# Patient Record
Sex: Female | Born: 1935 | Race: White | Hispanic: No | Marital: Married | Smoking: Former smoker
Health system: Southern US, Community
[De-identification: ages and names within clinical notes are randomized; demographics above are authoritative.]

## PROBLEM LIST (undated history)

## (undated) DIAGNOSIS — I10 Essential (primary) hypertension: Secondary | ICD-10-CM

## (undated) DIAGNOSIS — R112 Nausea with vomiting, unspecified: Secondary | ICD-10-CM

## (undated) DIAGNOSIS — C50919 Malignant neoplasm of unspecified site of unspecified female breast: Secondary | ICD-10-CM

## (undated) DIAGNOSIS — C50411 Malignant neoplasm of upper-outer quadrant of right female breast: Principal | ICD-10-CM

## (undated) DIAGNOSIS — H409 Unspecified glaucoma: Secondary | ICD-10-CM

## (undated) DIAGNOSIS — Z9889 Other specified postprocedural states: Secondary | ICD-10-CM

## (undated) HISTORY — DX: Unspecified glaucoma: H40.9

## (undated) HISTORY — PX: HERNIA REPAIR: SHX51

## (undated) HISTORY — PX: EYE SURGERY: SHX253

## (undated) HISTORY — DX: Malignant neoplasm of unspecified site of unspecified female breast: C50.919

## (undated) HISTORY — DX: Essential (primary) hypertension: I10

## (undated) HISTORY — PX: ABDOMINAL HYSTERECTOMY: SHX81

## (undated) HISTORY — PX: CHOLECYSTECTOMY: SHX55

## (undated) HISTORY — PX: CATARACT EXTRACTION: SUR2

## (undated) HISTORY — DX: Malignant neoplasm of upper-outer quadrant of right female breast: C50.411

---

## 1998-09-20 ENCOUNTER — Other Ambulatory Visit: Admission: RE | Admit: 1998-09-20 | Discharge: 1998-09-20 | Payer: Self-pay | Admitting: *Deleted

## 1999-10-11 ENCOUNTER — Other Ambulatory Visit: Admission: RE | Admit: 1999-10-11 | Discharge: 1999-10-11 | Payer: Self-pay | Admitting: *Deleted

## 2000-10-16 ENCOUNTER — Other Ambulatory Visit: Admission: RE | Admit: 2000-10-16 | Discharge: 2000-10-16 | Payer: Self-pay | Admitting: *Deleted

## 2000-10-17 ENCOUNTER — Other Ambulatory Visit: Admission: RE | Admit: 2000-10-17 | Discharge: 2000-10-17 | Payer: Self-pay | Admitting: *Deleted

## 2005-11-05 HISTORY — PX: BREAST SURGERY: SHX581

## 2007-04-17 ENCOUNTER — Ambulatory Visit (HOSPITAL_COMMUNITY): Admission: RE | Admit: 2007-04-17 | Discharge: 2007-04-18 | Payer: Self-pay | Admitting: Orthopedic Surgery

## 2007-07-04 ENCOUNTER — Encounter: Admission: RE | Admit: 2007-07-04 | Discharge: 2007-07-04 | Payer: Self-pay | Admitting: Emergency Medicine

## 2009-01-15 IMAGING — US US CAROTID DUPLEX BILAT
1 series · 14 of 24 positions shown · non-contrast
Comparison: none

CLINICAL DATA: Bilateral carotid bruit.  Hypertension.  Previous tobacco use. 
 BILATERAL CAROTID DUPLEX ULTRASOUND:
 There is no previous for comparison.

[Series 1: us carotid duplex bilat · 0.07mm/px · 14 of 66 slices shown]
[im 1/66]
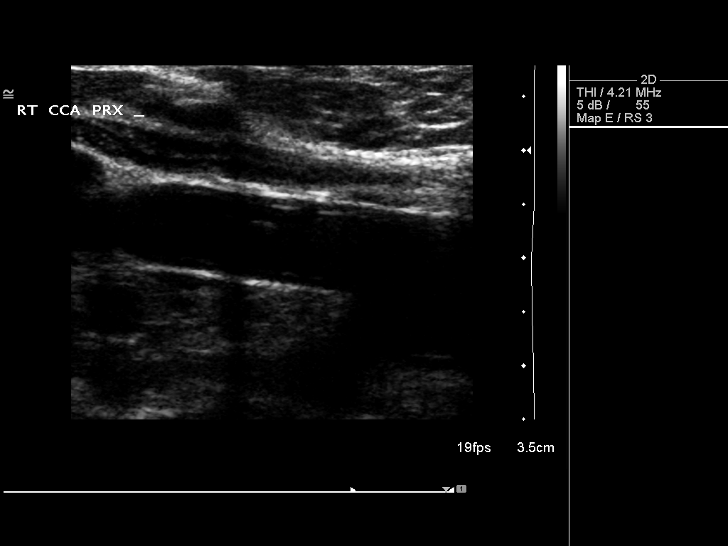
[im 6/66]
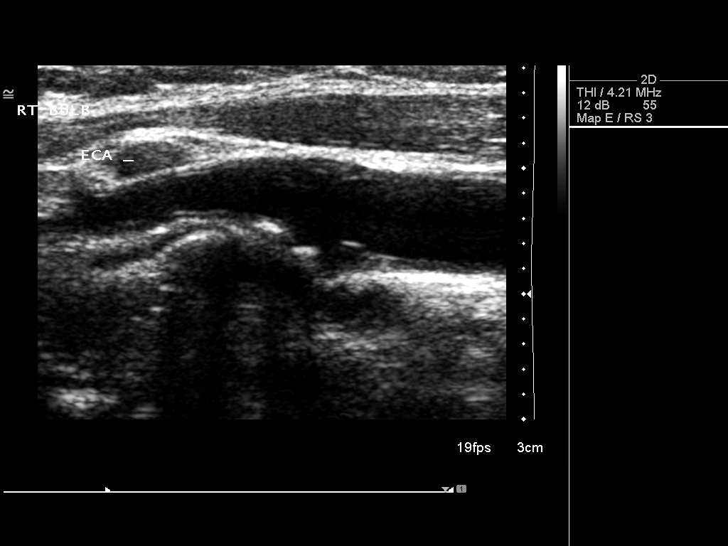
[im 12/66]
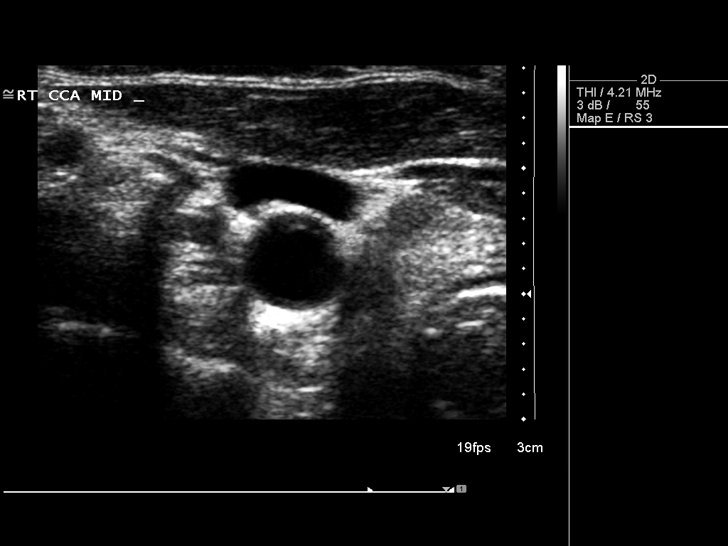
[im 17/66]
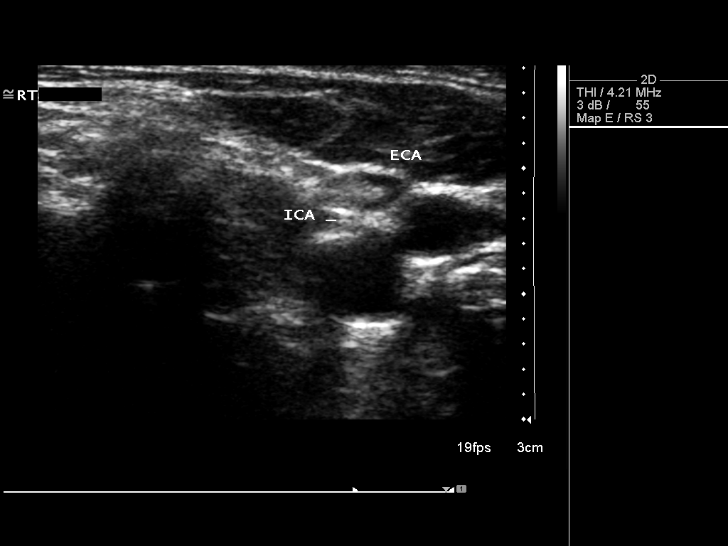
[im 20/66]
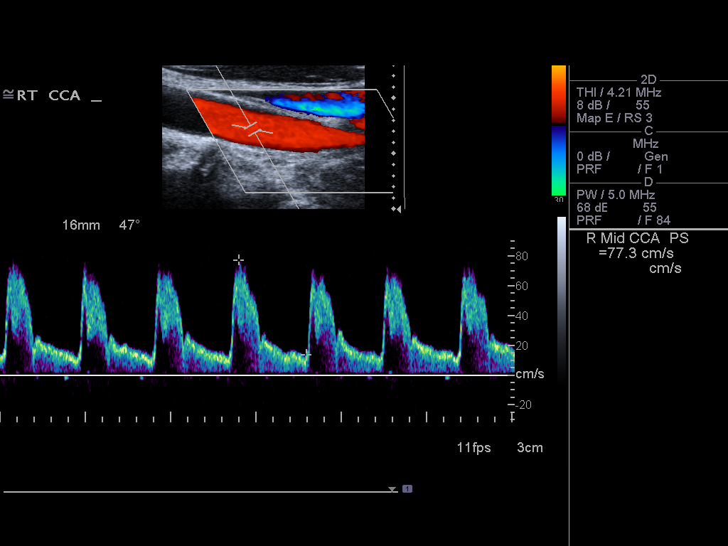
[im 26/66]
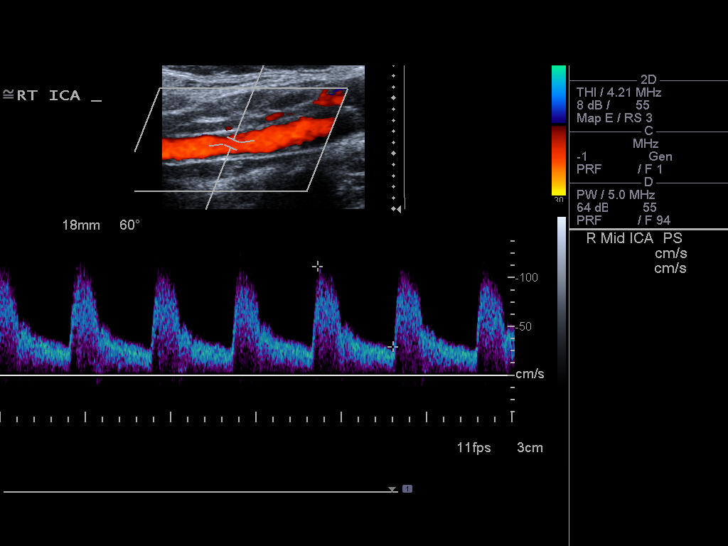
[im 32/66]
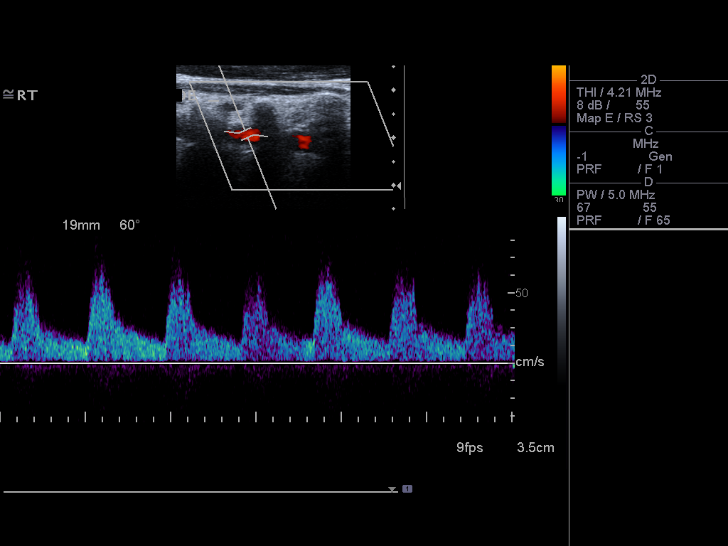
[im 34/66]
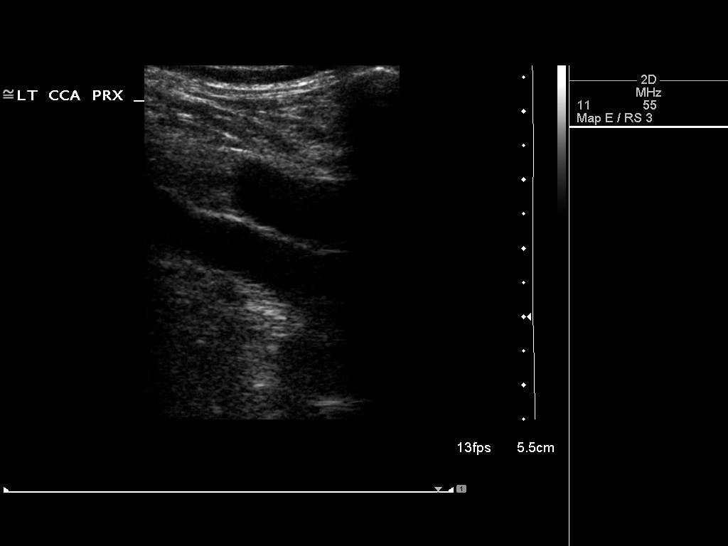
[im 40/66]
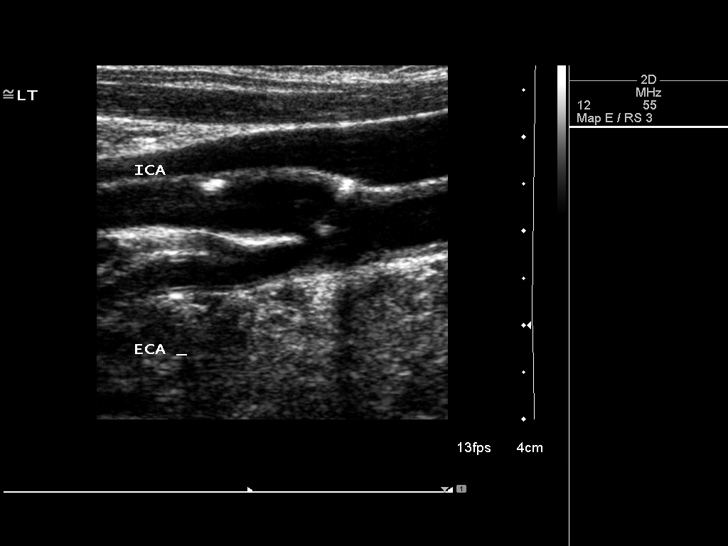
[im 46/66]
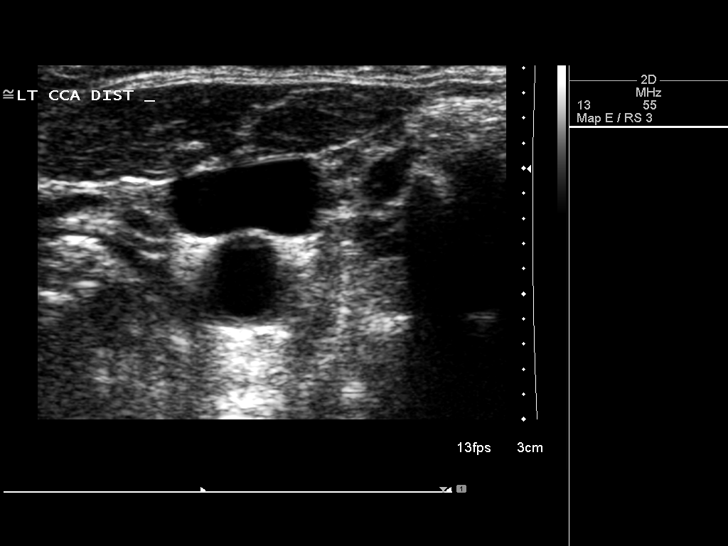
[im 51/66]
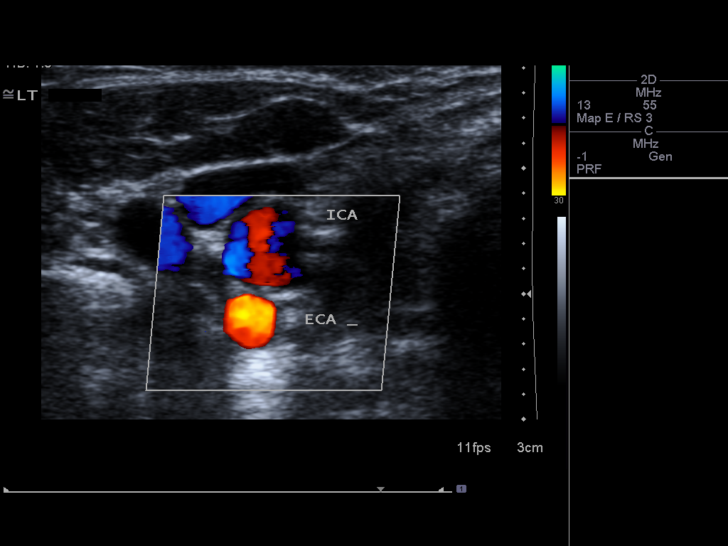
[im 54/66]
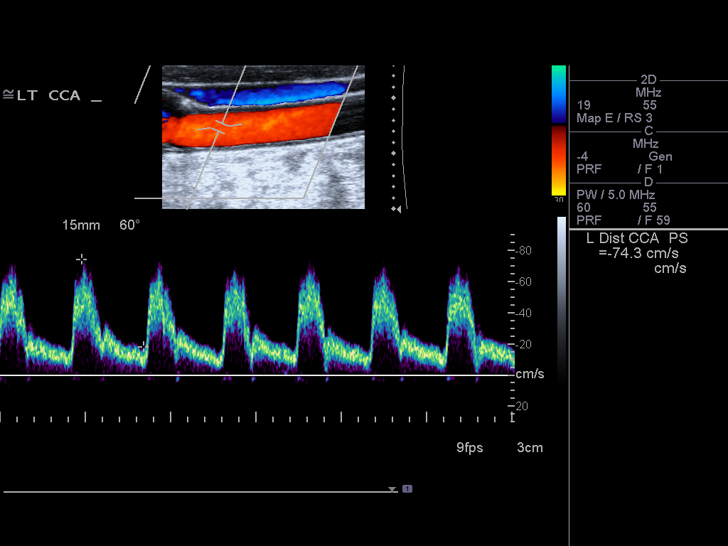
[im 60/66]
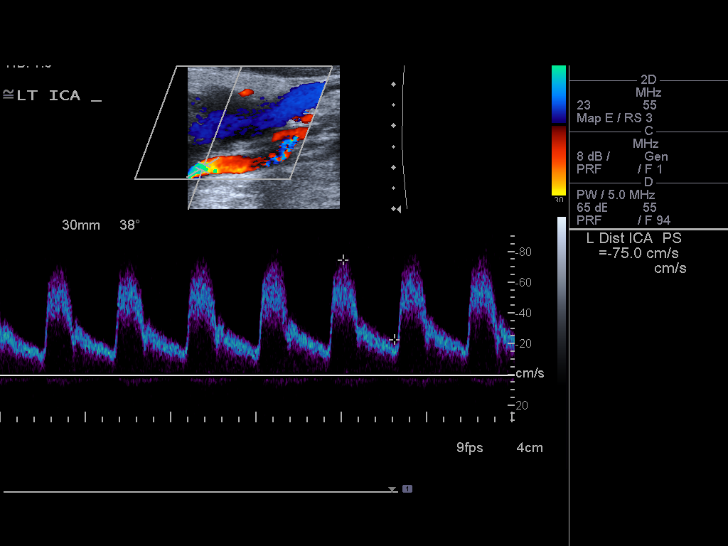
[im 66/66]
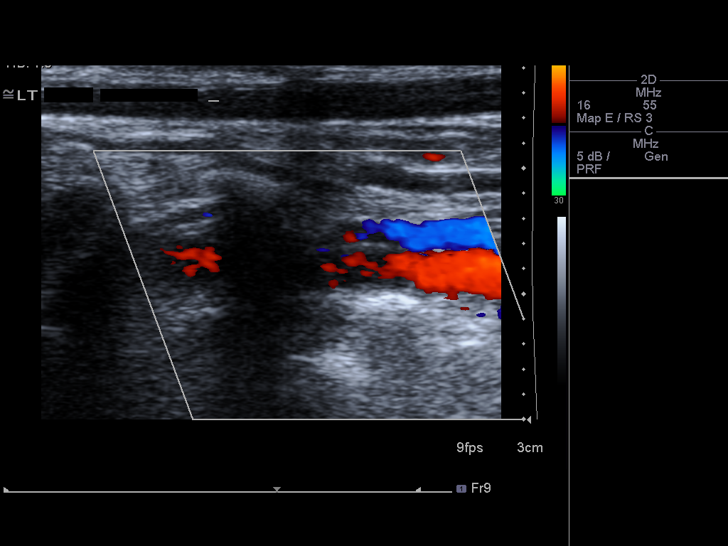

[14 of 24 positions shown; findings below may reference images not displayed]

FINDINGS: The exam is technically adequate.  There is partially calcified plaque in the distal common carotid artery extending through the carotid bulbs into the proximal internal carotid arteries.  Appropriate waveforms throughout the extracranial carotid circulation.  No focal aliasing on color Doppler interrogation.  Antegrade flow in both vertebral arteries.  
 The following Doppler flow velocity measurements were obtained (in cm/sec):

 SITE:  PEAK SYSTOLIC  END-DIASTOLIC

 RIGHT  ICA:   112    26
 RIGHT CCA:    88  17
 RIGHT ICA/CCA RATIO:
 RIGHT ECA:    105
 LEFT ICA:  82  20
 LEFT CCA:  97  17  
 LEFT ICA/CCA RATIO:
 LEFT ECA:  117
 Criteria:  Quantification of carotid stenosis is based on velocity parameters that correlate the residual internal carotid diameter with NASCET-based stenosis levels.
IMPRESSION: Bilateral proximal ICA plaque resulting in less than 50% diameter stenosis.  Continued surveillance recommended.

## 2009-01-20 ENCOUNTER — Encounter: Admission: RE | Admit: 2009-01-20 | Discharge: 2009-01-20 | Payer: Self-pay | Admitting: General Surgery

## 2009-01-24 ENCOUNTER — Encounter (INDEPENDENT_AMBULATORY_CARE_PROVIDER_SITE_OTHER): Payer: Self-pay | Admitting: General Surgery

## 2009-01-24 ENCOUNTER — Ambulatory Visit (HOSPITAL_BASED_OUTPATIENT_CLINIC_OR_DEPARTMENT_OTHER): Admission: RE | Admit: 2009-01-24 | Discharge: 2009-01-24 | Payer: Self-pay | Admitting: General Surgery

## 2009-05-24 IMAGING — US US-BREAST([ID])
1 series · 13 of 19 positions shown · non-contrast
Comparison: NONE

CLINICAL DATA: Follow-up to mammogram. 

RIGHT BREAST ULTRASOUND

[Series 1: us breast · 13 of 19 slices shown]
[im 1/19]
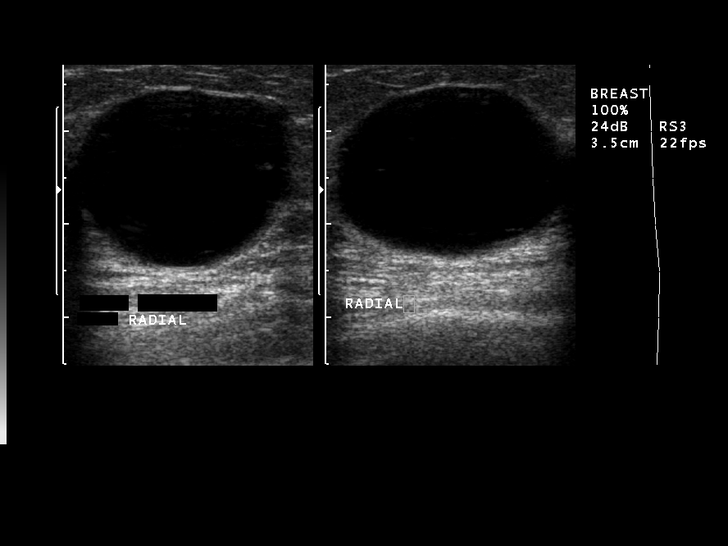
[im 3/19]
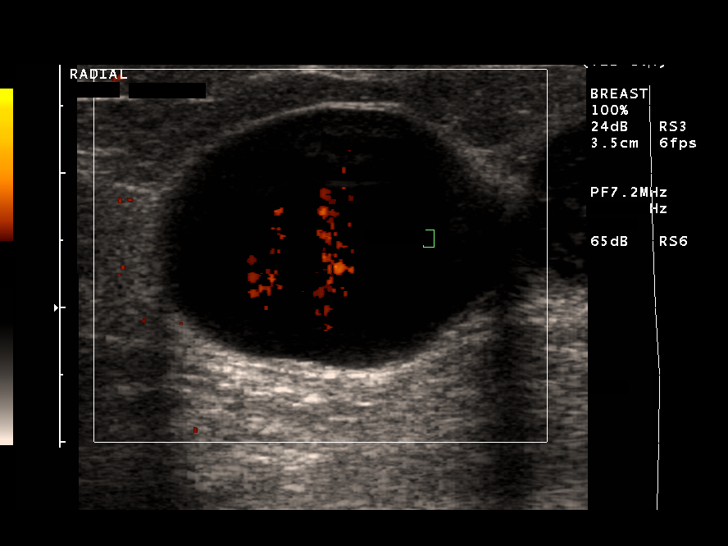
[im 4/19]
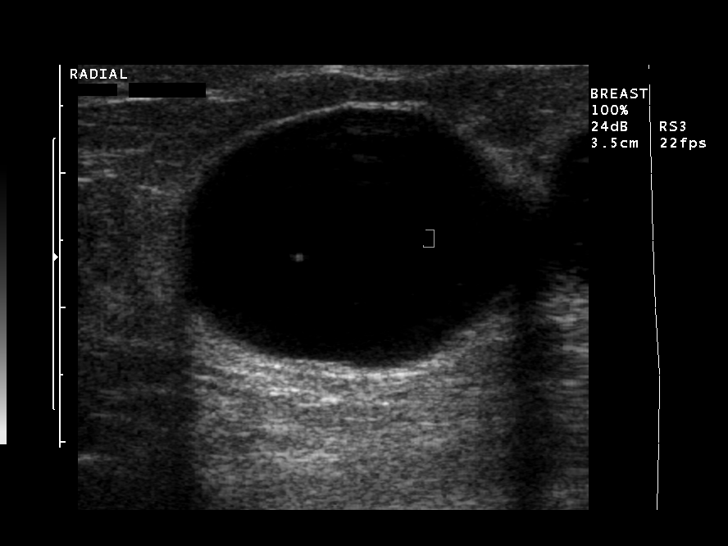
[im 6/19]
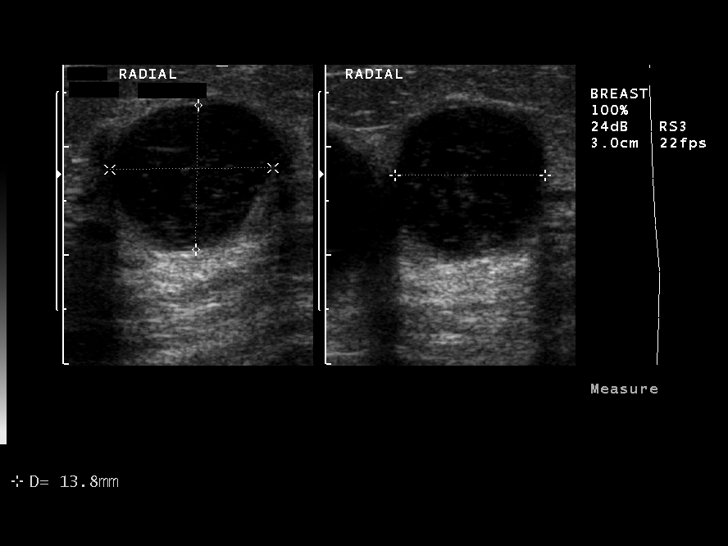
[im 7/19]
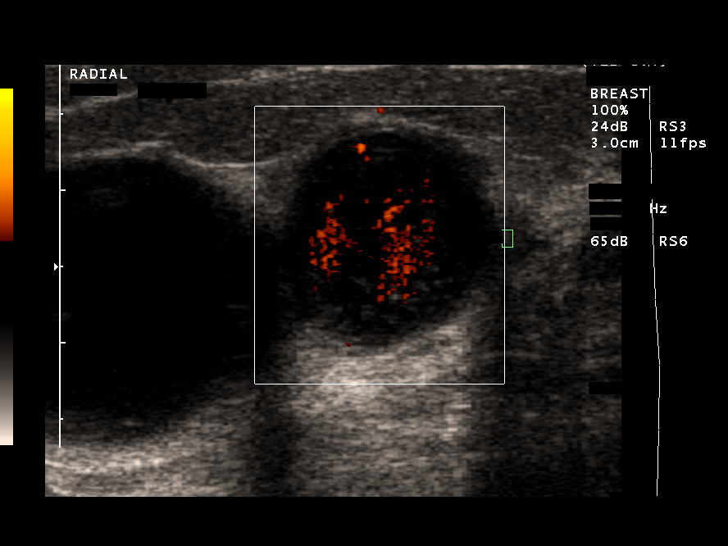
[im 9/19]
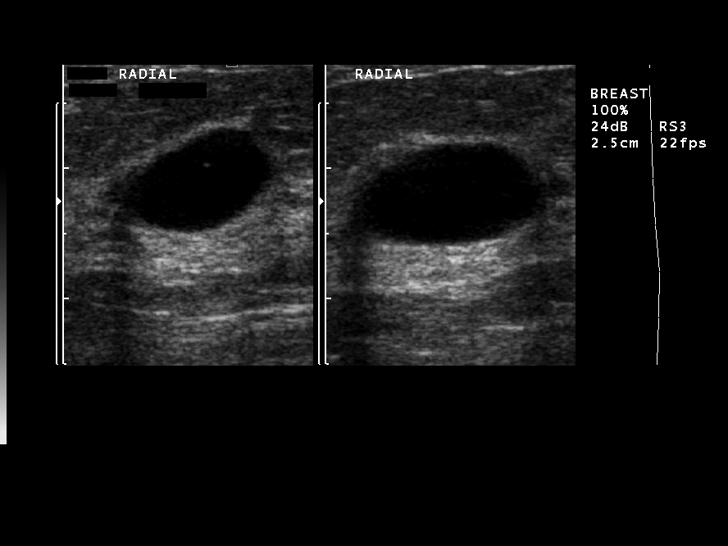
[im 10/19]
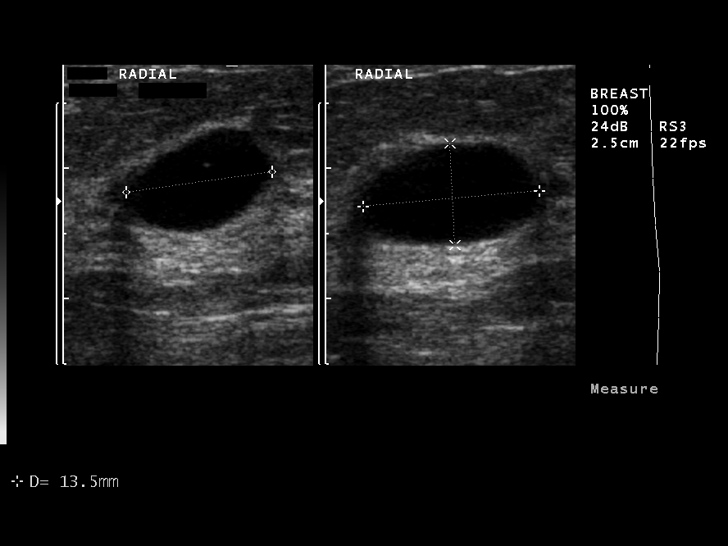
[im 11/19]
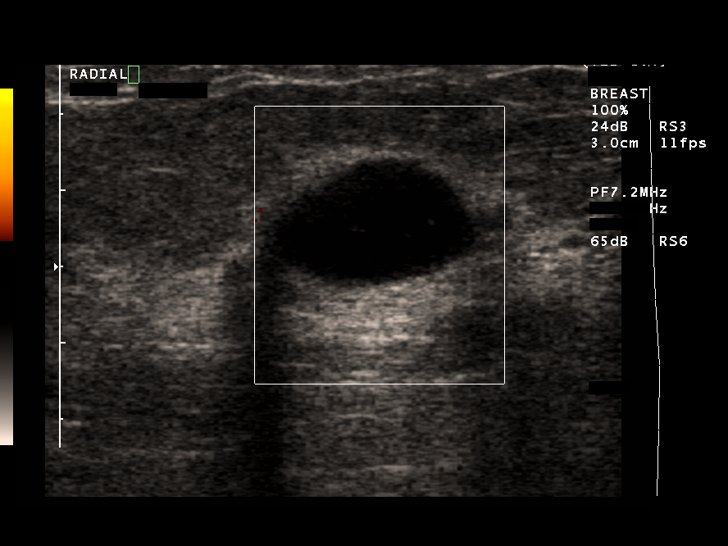
[im 13/19]
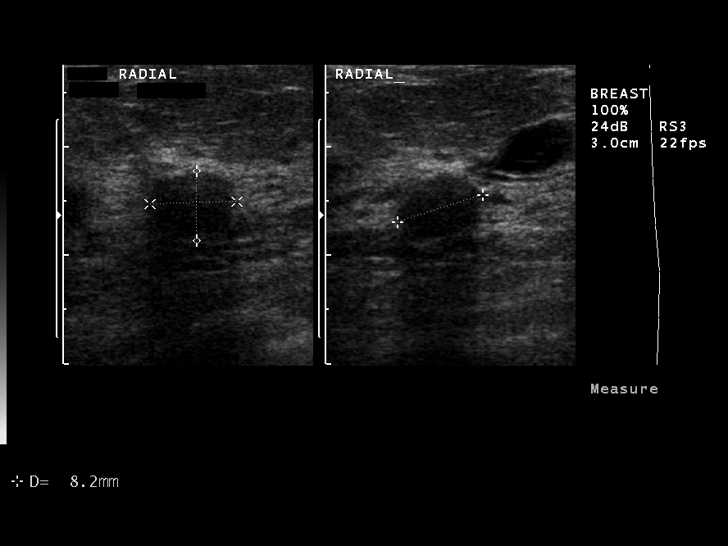
[im 14/19]
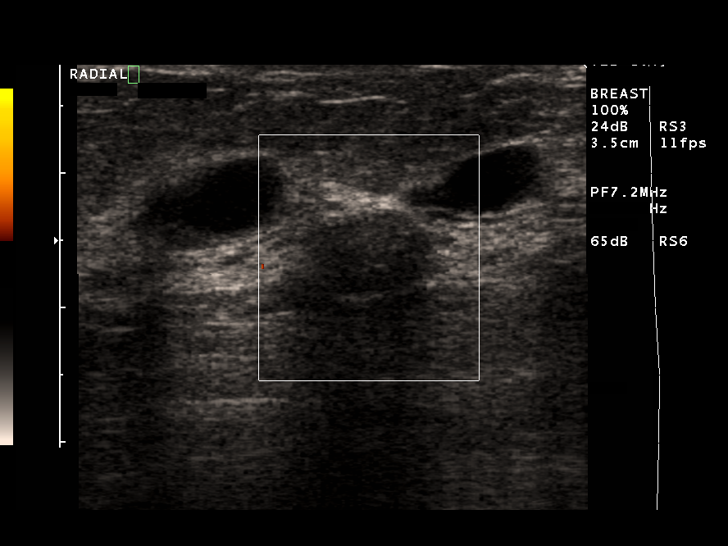
[im 16/19]
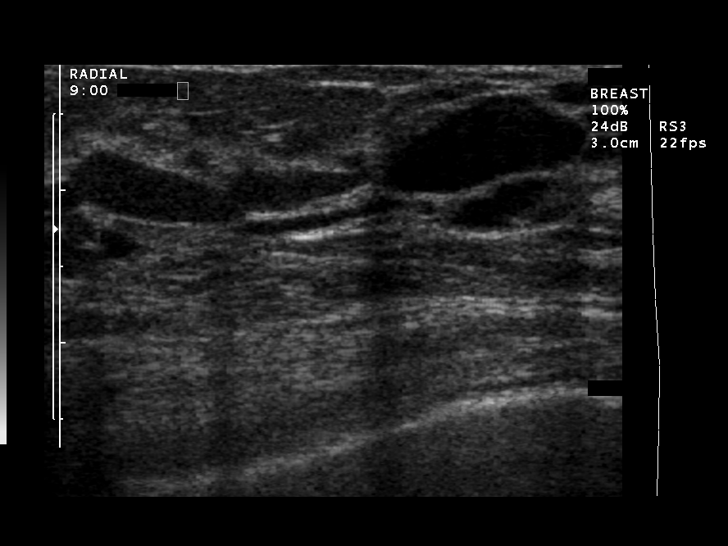
[im 17/19]
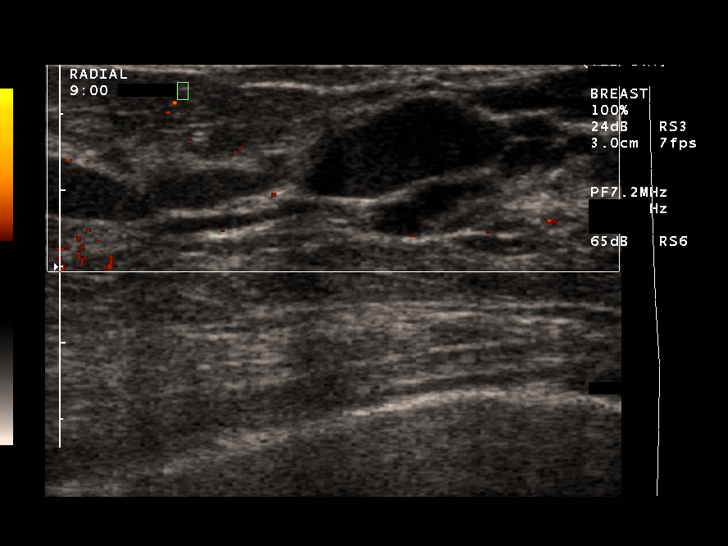
[im 19/19]
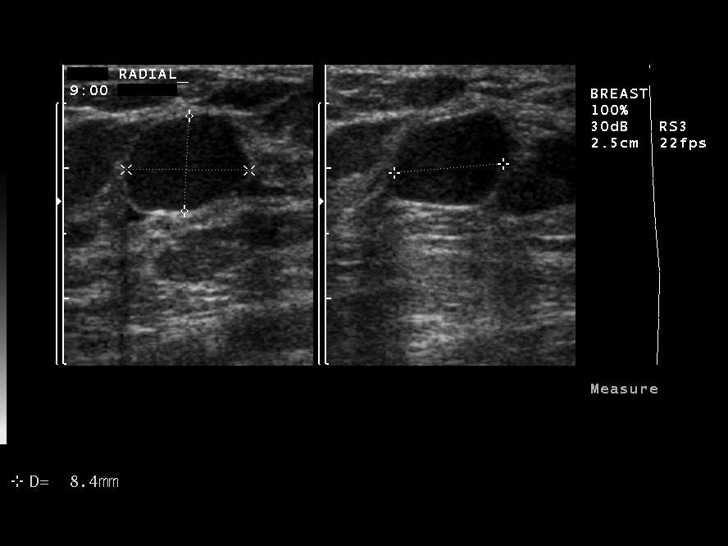

[13 of 19 positions shown; findings below may reference images not displayed]

FINDINGS: Prior mammograms dated 10-24-05 and 10-25-06 are now 
available for review.  Moderately dense glandular pattern is 
present.  Left breast appearances are stable from 10-25-06 with 
decrease in size of nodules from 3114 study. The dominant nodules 
in the upper outer right breast are again noted.  More laterally 
located nodule is stable in size.  More medially located nodule 
has increased in size. Ultrasound evaluation of the right breast 
demonstrates a dominant cyst in the 11 o???clock position of the 
right breast 10 cm from the nipple.  This measures 2.5 x 2.3 x
cm.  This contains mobile low-level echos consistent with debris.  
A second debris filled cyst is noted in the 11 o???clock position of 
the right breast located 7 cm from the nipple measuring 1.4 x
x 1.3 cm.  A third cyst is noted 5 cm from the nipple in the 11 
o???clock position measuring 1.3 x 0.8 x 1.1 cm. Located in the 11 
o???clock position 4 cm from the nipple is a hypoechoic nodule with 
homogeneous internal echos.  This exhibits shadowing and measures 
0.8 x 0.8 x 0.6 cm.  This does not fulfill the criteria for a cyst 
and is indeterminate. Located in the 9 o???clock areolar location is 
a dilated duct which contains debris.  There is also a thin walled 
probably debris filled cyst in the 9 o???clock areolar position 
measuring 0.8 x 0.9 x 0.7 cm.
IMPRESSION: Hypoechoic nodule in the 11 o???clock position of the 
right breast which exhibits shadowing.  As this may represent a 
solid nodule, I recommend ultrasound guided core 
aspiration/biopsy. The patient will be schedule through [facility] [REDACTED]. Additional debris filled and simple cysts 
noted as described above as well as a dilated areolar duct. 
BI-RADS 4:  Suspicious abnormality ??? biopsy should be considered. 
Saoun Mili, M.D. electronically reviewed on 11/11/2007 
Dict Date: 11/10/2007  Tran Date: 11/11/2007 CAV  CHU

## 2010-08-04 IMAGING — CR DG CHEST 2V
2 series · 2 of 2 positions shown · non-contrast
Comparison: [HOSPITAL] chest x-ray 04/16/2007.

CLINICAL DATA: Preop respiratory exam.  Hypertension.  Breast
cancer.

CHEST - 2 VIEW

[view not recorded (1 of 2)]
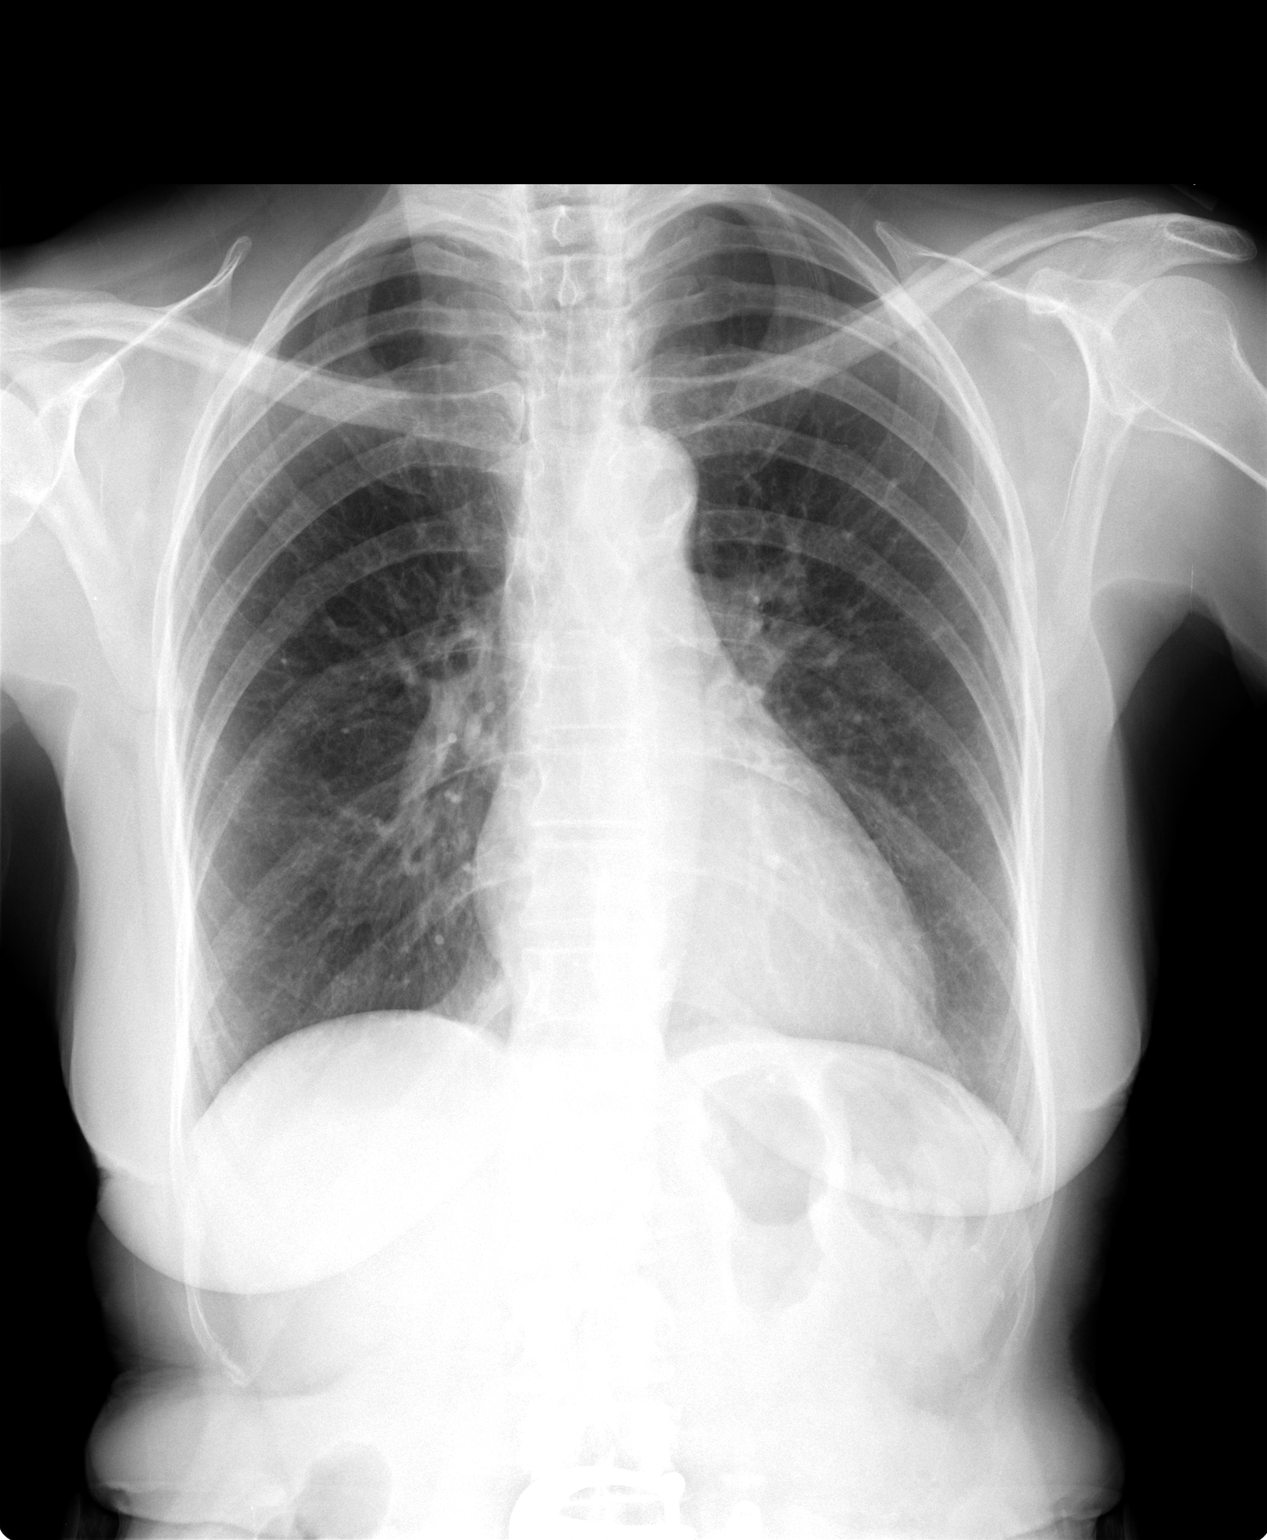

[view not recorded (2 of 2)]
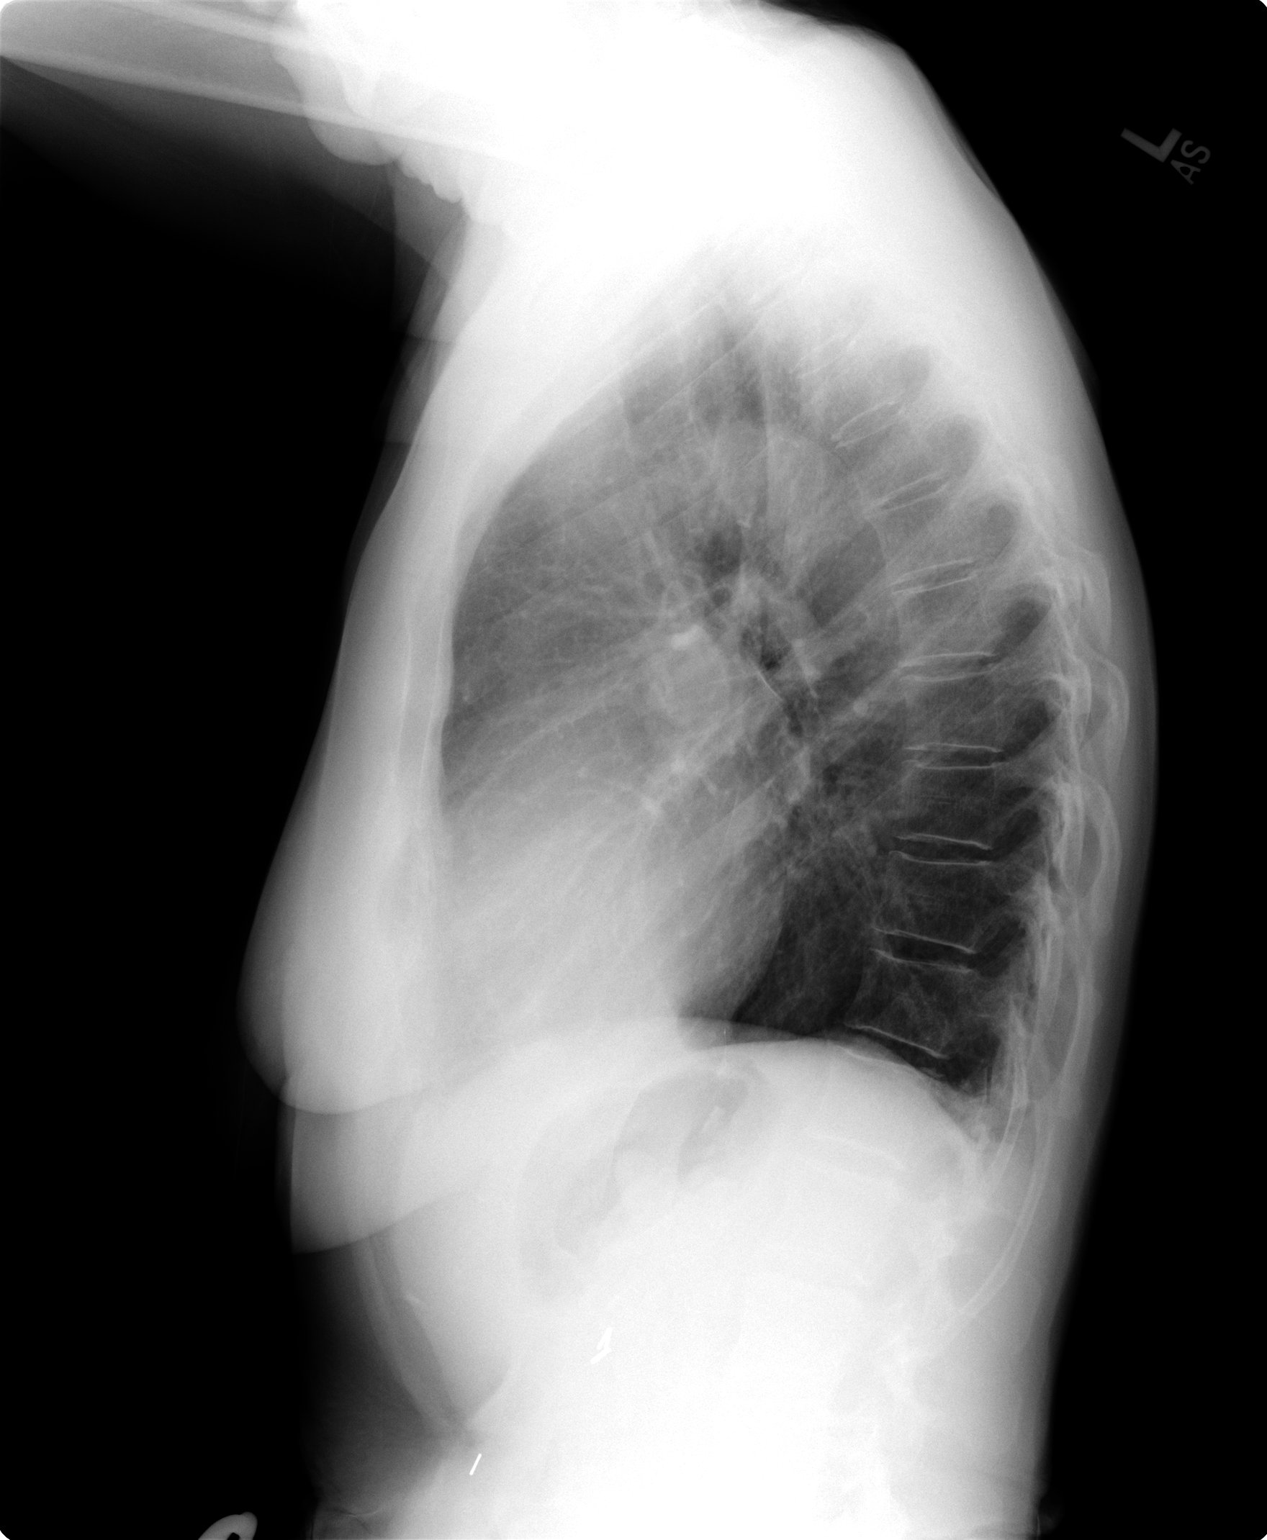

[2 of 2 positions shown; findings below may reference images not displayed]

FINDINGS: Stable minimal blunting posterior costophrenic angle is
seen.  Lungs remain clear.  Interval slight cardiomegaly with left
ventricular predominant configuration visualized.  Thoracic aortic
arch remains calcified.  Right cardiophrenic fat pad unchanged.
Mediastinum, hila, pleura and osseous structures remains stable and
unremarkable.
IMPRESSION: 1.  Slight interval cardiomegaly with left ventricular predominant
configuration.
2.  No acute or metastatic disease.

## 2011-02-15 LAB — COMPREHENSIVE METABOLIC PANEL
ALT: 9 U/L (ref 0–35)
Alkaline Phosphatase: 66 U/L (ref 39–117)
CO2: 27 mEq/L (ref 19–32)
Calcium: 9.5 mg/dL (ref 8.4–10.5)
Chloride: 97 mEq/L (ref 96–112)
GFR calc non Af Amer: 41 mL/min — ABNORMAL LOW (ref >60)
Glucose, Bld: 111 mg/dL — ABNORMAL HIGH (ref 70–99)
Sodium: 132 mEq/L — ABNORMAL LOW (ref 135–145)
Total Bilirubin: 0.7 mg/dL (ref 0.3–1.2)

## 2011-02-15 LAB — URINALYSIS, ROUTINE W REFLEX MICROSCOPIC
Bilirubin Urine: NEGATIVE
Hgb urine dipstick: NEGATIVE
Nitrite: NEGATIVE
Specific Gravity, Urine: 1.01 (ref 1.005–1.030)
pH: 7 (ref 5.0–8.0)

## 2011-02-15 LAB — DIFFERENTIAL
Basophils Absolute: 0 10*3/uL (ref 0.0–0.1)
Basophils Relative: 0 % (ref 0–1)
Eosinophils Absolute: 0.1 10*3/uL (ref 0.0–0.7)
Eosinophils Relative: 2 % (ref 0–5)
Neutrophils Relative %: 70 % (ref 43–77)

## 2011-02-15 LAB — CBC
Hemoglobin: 12.1 g/dL (ref 12.0–15.0)
MCHC: 35.2 g/dL (ref 30.0–36.0)
RBC: 4.02 MIL/uL (ref 3.87–5.11)
WBC: 5.5 10*3/uL (ref 4.0–10.5)

## 2011-03-20 NOTE — Op Note (Signed)
Ana Mckinney, Ana Mckinney              ACCOUNT NO.:  [id number]   MEDICAL RECORD NO.:  [id number]          PATIENT TYPE:  AMB   LOCATION:  DSC                          FACILITY:  [facility]   PHYSICIAN:  Sharlet Salina T. Hoxworth, M.D.DATE OF BIRTH:  Jan 10, 1926   DATE OF PROCEDURE:  01/24/2009  DATE OF DISCHARGE:                               OPERATIVE REPORT   PREOPERATIVE DIAGNOSIS:  Abnormal mammogram and atypia, left breast.   POSTOPERATIVE DIAGNOSIS:  Abnormal mammogram and atypia, left breast.   SURGICAL PROCEDURE:  Needle-localized left breast excisional biopsy.   SURGEON:  Lorne Skeens. Hoxworth, MD   ANESTHESIA:  Laryngeal mask general.   BRIEF HISTORY:  Ms. [name] is an 77 year old female with long history of  fibrocystic disease.  On recent mammogram, there was a new mass noted at  3 o'clock position in the left breast.  Ultrasound showed a hypoechoic  area with some suspicious appearance.  An ultrasound core biopsy was  performed.  This shows significant fibrocystic change.  No malignancy,  but moderate atypia and complete excision of the area was recommended by  pathology.  I have discussed this with the patient and she is in  agreement.  She is now brought to the operating room for needle  localized excision.  The nature of the procedure, indications, risks of  anesthesia, bleeding, and infection were discussed and understood.   DESCRIPTION OF OPERATION:  Following successful needle localization, the  patient was brought to the operating room and placed in supine position  on the operating table.  A laryngeal mask general anesthesia was  induced.  She received preoperative antibiotics.  The left breast was  widely sterilely prepped and draped.  Correct patient and procedure were  verified.  I made a curvilinear incision medial to the wire insertion  site.  Dissection was carried down through subcutaneous tissue to the  breast capsule.  The wire was then brought into the incision.   Following  this, a generous specimen of breast tissue was excised around the shaft  of the wire with cautery and the specimen removed.  Specimen x-ray in  operating room showed the clip to be in the center of the specimen.  There was some thickening in the area that was completely excised.  Hemostasis was obtained with cautery and the soft tissue was infiltrated  with local anesthesia.  The subcu was reapproximated with interrupted 4-  0 Monocryl and the skin with subcuticular 4-0 Monocryl and Dermabond.  Sponges, needle, and instruments counts were correct.  The patient was  taken to recovery in good condition.      Lorne Skeens. Hoxworth, M.D.  Electronically Signed     BTH/MEDQ  D:  01/24/2009  T:  01/25/2009  Job:  [id number]

## 2011-03-20 NOTE — Op Note (Signed)
Ana Mckinney, Ana Mckinney              ACCOUNT NO.:  [id number]   MEDICAL RECORD NO.:  [id number]          PATIENT TYPE:  AMB   LOCATION:  DAY                          [facility]ACILITY:  [facility]   PHYSICIAN:  Georges Lynch. Gioffre, M.D.DATE OF BIRTH:  Aug 05, 1926   DATE OF PROCEDURE:  04/17/2007  DATE OF DISCHARGE:                               OPERATIVE REPORT   SURGEON:  Georges Lynch. Darrelyn Hillock, M.D.   ASSISTANT:  Marlowe Kays, M.D.   PREOP DIAGNOSES:  1. Herniated lumbar disk at L4-5 on the right.  2. Severe lateral recess stenosis at L4-5 on the right.   POSTOP DIAGNOSES:  1. Herniated lumbar disk at L4-5 on the right.  2. Severe lateral recess stenosis at L4-5 on the right.   OPERATIONS:  1. Central decompressive lumbar laminectomy at L4-5 for spinal      stenosis.  2. Microdiskectomy at L4-5 on the right for herniated disk.  3. Foraminotomies on the right L4-5.   PROCEDURE:  Under general anesthesia with the patient on the spinal  frame.  She had 1 gram of IV Ancef.  A routine orthopedic prep and  draping of the back was carried out.  Two needles were placed in back  for localization purposes.  X-ray was taken.  At this time an incision  was made over the L4-5 interspace.  Bleeders were identified and  cauterized.  The muscle was separated from the lamina and spinous  processes bilaterally.  I then inserted the [facility] retractors.  Another x-ray was taken with the instruments in place.   Once we identified the L4-5 space, we went down and removed the spinous  process of L4 and a part of L5.  We did a central decompressive lumbar  laminectomy, at this time, in the usual fashion with great care taken to  protect the dura.  We did bilateral foraminotomies as well.  Note, at  this time, the ligamentum flavum was quite thickened.  We removed that  with great care not to injure the underlying dura.  We thoroughly  irrigated out the area.  We brought in the microscope; at this time.  We  isolated the L5 root on the right and identified a large herniated disk.  A cruciate incision was made in the posterior longitudinal ligament; and  a complete diskectomy was carried out.  We followed the root out, now  through the foramina on the right; and did a nice foraminotomy as well.  Note, we decompressed both sides, although she was symptomatic on the  right only; but she had rather significant stenosis bilaterally.   We thoroughly irrigated out the area, and loosely applied some thrombin-  soaked Gelfoam.  I closed the wound in layers in the usual fashion;  except the deep proximal part; I partially left open for drainage  purposes to avoid any  compression on the sac.  Also note, we were able to easily pass the  hockey-stick proximally, distally, and down the foramina to make sure  that we now had a nice wide-open decompression in all directions.  Sterile Neosporin dressings were applied after the  skin was closed with  metal staples.           ______________________________  Georges Lynch Darrelyn Hillock, M.D.     RAG/MEDQ  D:  04/17/2007  T:  04/17/2007  Job:  [id number]

## 2011-08-23 LAB — BASIC METABOLIC PANEL
Calcium: 10.3
Creatinine, Ser: 1.45 — ABNORMAL HIGH
GFR calc Af Amer: 42 — ABNORMAL LOW
GFR calc non Af Amer: 35 — ABNORMAL LOW
Sodium: 145

## 2011-08-23 LAB — HEMOGLOBIN AND HEMATOCRIT, BLOOD: HCT: 36.9

## 2011-08-23 LAB — PROTIME-INR: Prothrombin Time: 12.4

## 2015-05-03 ENCOUNTER — Other Ambulatory Visit: Payer: Self-pay | Admitting: Radiology

## 2015-05-06 ENCOUNTER — Encounter: Payer: Self-pay | Admitting: *Deleted

## 2015-05-06 ENCOUNTER — Telehealth: Payer: Self-pay | Admitting: *Deleted

## 2015-05-06 DIAGNOSIS — C50411 Malignant neoplasm of upper-outer quadrant of right female breast: Secondary | ICD-10-CM | POA: Insufficient documentation

## 2015-05-06 HISTORY — DX: Malignant neoplasm of upper-outer quadrant of right female breast: C50.411

## 2015-05-06 NOTE — Telephone Encounter (Signed)
Confirmed [facility] for 05/11/15 at 1200.  Instructions and contact information given.  Oncology Nurse Navigator Documentation  Oncology Nurse Navigator Flowsheets 05/06/2015  Navigator Encounter Type Introductory phone call;Clinic/MDC  Barriers/Navigation Needs No barriers at this time  Time Spent with Patient 30

## 2015-05-09 NOTE — Progress Notes (Signed)
[facility]  Telephone:([phone]) [phone] Fax:(336) [phone]     ID: OLUBUNMI ROTHENBERGER DOB: 07-Mar-1926  MR#: [id number]  LZJ#:[id number]  Patient Care Team: Ulyses Southward, MD as PCP - General (Pediatrics) Excell Seltzer, MD as Consulting Physician (General Surgery) Chauncey Cruel, MD as Consulting Physician (Oncology) Arloa Koh, MD as Consulting Physician (Radiation Oncology) Mauro Kaufmann, RN as Registered Nurse Rockwell Germany, RN as Registered Nurse Holley Bouche, NP as Nurse Practitioner (Nurse Practitioner) PCP: Ulyses Southward, MD OTHER MD:  CHIEF COMPLAINT: estrogen receptor positive breast cancer  CURRENT TREATMENT: awaiting definitive surgery   BREAST CANCER HISTORY: [place] (pronounced GATH-ell, and goes by "Ana Mckinney") had routine screening mammography at [facility] 04/18/2015 showing the breast composition to be category C. In the right breast upper outer quadrant there was a 1.6 cm mass associated with architectural distortion. The left breast upper outer quadrant there was a 1.5 cm mass that had increased in size. Also also a 2 cm oval mass in the left breast lower inner aspect. On 04/27/2015 the patient underwent rightl diagnostic mammography with tomosynthesis. This confirmed a new mass in the right breast at the 12:00 position. This was associated with architectural distortion. There were no other significant masses or calcifications seen in the right breast.ultrasound of the right breast upper outer quadrant confirmed a 2 cm irregular mass correlating with mammography. In the left breast there was a 2.1 cm cyst at the 8:00 position and also a 1.6 cm simple cyst at the 1:00 position.both these were felt to be benign.  On 05/03/2015 the patient underwent biopsy of the right breast upper outer quadrant lesionjust described, with the pathology ([id number]) showing an invasive ductal carcinoma, grade 1, estrogen and progesterone receptor both 100% positive, with strong  staining intensity, with an MIB-1 of 40%, and HER-2 negative, with a signals ratio of 1.48 and the number per cell 3.25.  Her subsequent history is detailed below  INTERVAL HISTORY: Roxan Hockey was evaluated in the multidisciplinary breast cancer clinic 05/11/2015 accompanied by her husband Herbie [place]. Her case was also percent and at the multidisciplinary breast cancer conference that same morning. At that time a pulmonary plan was proposed, namely for breast conserving surgery with sentinel lymph node sampling, with consideration of radiation depending on results, to be followed by anti-estrogens  REVIEW OF SYSTEMS: There were no specific symptoms leading to the original mammogram, which was routinely scheduled. The patient denies unusual headaches, visual changes, nausea, vomiting, stiff neck, dizziness, or gait imbalance. There has been no cough, phlegm production, or pleurisy, no chest pain or pressure, and no change in bowel or bladder habits. The patient denies fever, rash, bleeding, unexplained fatigue or unexplained weight loss. A detailed review of systems was otherwise entirely negative.  PAST MEDICAL HISTORY: Past Medical History  Diagnosis Date  . Breast cancer of upper-outer quadrant of right female breast 05/06/2015  . Breast cancer   . Hypertension   . Glaucoma     PAST SURGICAL HISTORY: Past Surgical History  Procedure Laterality Date  . Abdominal hysterectomy    . Cholecystectomy    . Hernia repair    . Cataract extraction      FAMILY HISTORY Family History  Problem Relation Age of Onset  . Lung cancer Maternal Aunt   . Lung cancer Maternal Uncle   the patient's father died from pneumonia at the age of 51. The patient's mother died from lung cancer at the age of 73. There is lung cancer also on  the paternal side. The patient had one brother, 2 sisters. Her brother has had melanoma 2, most recently ocular melanoma, and has survived. There is no other history of breast or ovarian  cancer in the family to her knowledge  GYNECOLOGIC HISTORY:  No LMP recorded. Menarche age 58, first live birth age 54, the patient is [place] P2. She stopped having periods approximately 1981. She did use hormone replacement until approximately 2 weeks ago. She never used oral contraceptives. She is status post remote hysterectomy with bilateral salpingo-oophorectomy.  SOCIAL HISTORY:  The patient used to work in [place]. Her husband of 66+ years, Herbie Baltimore, also was in the food service. Their children are Liz Claiborne lives in [place] and works as an Chief Financial Officer, and Jeneen Rinks lives in [place] and works in land mine recommendation. The patient has 3 grandchildren. She attends the [employer] in place   HEALTH MAINTENANCE: History  Substance Use Topics  . Smoking status: Former Research scientist (life sciences)  . Smokeless tobacco: Not on file  . Alcohol Use: No     Colonoscopy: 2006  PAP:  Bone density:2015  Lipid panel:  No Known Allergies  Current Outpatient Prescriptions  Medication Sig Dispense Refill  . amLODipine (NORVASC) 10 MG tablet Take 10 mg by mouth daily.    Marland Kitchen aspirin 325 MG EC tablet Take 325 mg by mouth daily.    . Calcium Citrate (CITRACAL PO) Take by mouth.    . conjugated estrogens (PREMARIN) vaginal cream Place 1 Applicatorful vaginally daily. 1/2 gram 2X week    . hydrochlorothiazide (HYDRODIURIL) 25 MG tablet Take 25 mg by mouth daily.    . Multiple Vitamin (MULTIVITAMIN) capsule Take 1 capsule by mouth daily.    . Multiple Vitamins-Minerals (ICAPS MV PO) Take by mouth.    . Omega-3 Fatty Acids (FISH OIL) 1000 MG CAPS Take 1 capsule by mouth daily.    . potassium chloride (KLOR-CON) 8 MEQ tablet Take 8 mEq by mouth daily.    . traZODone (DESYREL) 50 MG tablet Take 50 mg by mouth at bedtime.     No current facility-administered medications for this visit.    OBJECTIVE: older white woman who appears well Filed Vitals:   05/11/15 1255  BP: 172/71    Pulse: 74  Temp: 98.1 F (36.7 C)  Resp: 18     Body mass index is 19.94 kg/(m^2).    ECOG FS:0 - Asymptomatic  Ocular: Sclerae unicteric, pupils equal, round and reactive to light Ear-nose-throat: Oropharynx clear and moist, full upper plate Lymphatic: No cervical or supraclavicular adenopathy Lungs no rales or rhonchi, good excursion bilaterally Heart regular rate and rhythm, no murmur appreciated Abd soft, nontender, positive bowel sounds MSK no focal spinal tenderness, no joint edema Neuro: non-focal, well-oriented, appropriate affect Breasts: the right breast is status post recent biopsy. I do not palpate a mass. There are no skin or nipple changes of concern. The right axilla is benign. The left breast is unremarkable.   LAB RESULTS:  CMP     Component Value Date/Time   NA 140 05/11/2015 1228   NA 132* 01/20/2009 1046   K 3.8 05/11/2015 1228   K 4.5 01/20/2009 1046   CL 97 01/20/2009 1046   CO2 26 05/11/2015 1228   CO2 27 01/20/2009 1046   GLUCOSE 109 05/11/2015 1228   GLUCOSE 111* 01/20/2009 1046   BUN 23.8 05/11/2015 1228   BUN 15 01/20/2009 1046   CREATININE 1.4* 05/11/2015 1228  CREATININE 1.25* 01/20/2009 1046   CALCIUM 10.6* 05/11/2015 1228   CALCIUM 9.5 01/20/2009 1046   PROT 8.2 05/11/2015 1228   PROT 7.3 01/20/2009 1046   ALBUMIN 4.1 05/11/2015 1228   ALBUMIN 3.9 01/20/2009 1046   AST 23 05/11/2015 1228   AST 31 01/20/2009 1046   ALT 13 05/11/2015 1228   ALT 9 01/20/2009 1046   ALKPHOS 70 05/11/2015 1228   ALKPHOS 66 01/20/2009 1046   BILITOT 0.51 05/11/2015 1228   BILITOT 0.7 01/20/2009 1046   GFRNONAA 41* 01/20/2009 1046   GFRAA * 01/20/2009 1046    50        The eGFR has been calculated using the MDRD equation. This calculation has not been validated in all clinical situations. eGFR's persistently <60 mL/min signify possible Chronic Kidney Disease.    INo results found for: SPEP, UPEP  Lab Results  Component Value Date   WBC 7.3  05/11/2015   NEUTROABS 4.5 05/11/2015   HGB 14.3 05/11/2015   HCT 42.5 05/11/2015   MCV 85.5 05/11/2015   PLT 292 05/11/2015      Chemistry      Component Value Date/Time   NA 140 05/11/2015 1228   NA 132* 01/20/2009 1046   K 3.8 05/11/2015 1228   K 4.5 01/20/2009 1046   CL 97 01/20/2009 1046   CO2 26 05/11/2015 1228   CO2 27 01/20/2009 1046   BUN 23.8 05/11/2015 1228   BUN 15 01/20/2009 1046   CREATININE 1.4* 05/11/2015 1228   CREATININE 1.25* 01/20/2009 1046      Component Value Date/Time   CALCIUM 10.6* 05/11/2015 1228   CALCIUM 9.5 01/20/2009 1046   ALKPHOS 70 05/11/2015 1228   ALKPHOS 66 01/20/2009 1046   AST 23 05/11/2015 1228   AST 31 01/20/2009 1046   ALT 13 05/11/2015 1228   ALT 9 01/20/2009 1046   BILITOT 0.51 05/11/2015 1228   BILITOT 0.7 01/20/2009 1046       No results found for: LABCA2  No components found for: LABCA125  No results for input(s): INR in the last 168 hours.  Urinalysis    Component Value Date/Time   COLORURINE YELLOW 01/20/2009 1044   APPEARANCEUR CLOUDY* 01/20/2009 1044   LABSPEC 1.010 01/20/2009 1044   PHURINE 7.0 01/20/2009 1044   GLUCOSEU NEGATIVE 01/20/2009 1044   HGBUR NEGATIVE 01/20/2009 1044   BILIRUBINUR NEGATIVE 01/20/2009 1044   KETONESUR NEGATIVE 01/20/2009 1044   PROTEINUR NEGATIVE 01/20/2009 1044   UROBILINOGEN 0.2 01/20/2009 1044   NITRITE NEGATIVE 01/20/2009 1044   LEUKOCYTESUR  01/20/2009 1044    NEGATIVE MICROSCOPIC NOT DONE ON URINES WITH NEGATIVE PROTEIN, BLOOD, LEUKOCYTES, NITRITE, OR GLUCOSE <1000 mg/dL.    STUDIES: No results found.  ASSESSMENT: 81 y.o. [place] woman status post right breast upper outer quadrant biopsy 05/03/2015 for a clinical  T1c N0, stage IA  invasive ductal carcinoma, grade 1, estrogen and progesterone receptor strongly positive, with an MIB-1 of 40%, HER-2 pending.  (1) breast conservation surgery with sentinel lymph node sampling pending  (2) adjuvant radiation depending  on surgical results  (3) to start anastrozole after completion of local therapy  PLAN: We spent the better part of today's hour-long appointment discussing the biology of breast cancer in general, and the specifics of the patient's tumor in particular. Ana Mckinney has a good understanding of the difference between local and systemic therapy for breast cancer. As far as local therapy is concerned she is going to have a lumpectomy and sentinel  lymph node sampling. Depending on the size of the tumor and whether lymph nodes are involved and of course a good the margin clearance is she may or may not need radiation.  In any case she will benefit from antiestrogen so we discussed that today. I am going to see her in approximately a month by which time we should have the final pathology results and should be able to start anastrozole. She had a bone density within the last year which we will try to obtain before that visit.  We also discussed chemotherapy. Given the fact that this is a low-grade tumor and strongly estrogen and progesterone receptor positive, I do not see a role for chemotherapy in this very elderly although otherwise healthy woman and do not plan to send an Oncotype.  The patient has a good understanding of the overall plan. She agrees with it. She knows the goal of treatment in her case is cure. She will call with any problems that may develop before her next visit here.  Chauncey Cruel, MD   05/11/2015 4:10 PM Medical Oncology and Hematology [facility] [address] [place], [place] [address] Tel. [phone]    Fax. [phone]

## 2015-05-11 ENCOUNTER — Encounter: Payer: Self-pay | Admitting: Physical Therapy

## 2015-05-11 ENCOUNTER — Ambulatory Visit (HOSPITAL_BASED_OUTPATIENT_CLINIC_OR_DEPARTMENT_OTHER): Payer: Medicare HMO | Admitting: Oncology

## 2015-05-11 ENCOUNTER — Ambulatory Visit: Payer: Medicare HMO

## 2015-05-11 ENCOUNTER — Ambulatory Visit: Payer: Medicare HMO | Attending: General Surgery | Admitting: Physical Therapy

## 2015-05-11 ENCOUNTER — Ambulatory Visit
Admission: RE | Admit: 2015-05-11 | Discharge: 2015-05-11 | Disposition: A | Discharge status: Home / Self Care | Payer: Medicare HMO | Source: Ambulatory Visit | Attending: Radiation Oncology | Admitting: Radiation Oncology

## 2015-05-11 ENCOUNTER — Other Ambulatory Visit: Payer: Self-pay | Admitting: General Surgery

## 2015-05-11 ENCOUNTER — Telehealth: Payer: Self-pay | Admitting: Oncology

## 2015-05-11 ENCOUNTER — Encounter: Payer: Self-pay | Admitting: Oncology

## 2015-05-11 ENCOUNTER — Other Ambulatory Visit (HOSPITAL_BASED_OUTPATIENT_CLINIC_OR_DEPARTMENT_OTHER): Payer: Medicare HMO

## 2015-05-11 VITALS — BP 172/71 | HR 74 | Temp 98.1°F | Resp 18 | Ht 61.0 in | Wt 105.5 lb

## 2015-05-11 DIAGNOSIS — C50911 Malignant neoplasm of unspecified site of right female breast: Secondary | ICD-10-CM | POA: Insufficient documentation

## 2015-05-11 DIAGNOSIS — R293 Abnormal posture: Secondary | ICD-10-CM | POA: Diagnosis present

## 2015-05-11 DIAGNOSIS — Z170 Estrogen receptor positive status [ER+]: Secondary | ICD-10-CM

## 2015-05-11 DIAGNOSIS — C50411 Malignant neoplasm of upper-outer quadrant of right female breast: Secondary | ICD-10-CM | POA: Diagnosis not present

## 2015-05-11 LAB — COMPREHENSIVE METABOLIC PANEL (CC13)
ALBUMIN: 4.1 g/dL (ref 3.5–5.0)
ALT: 13 U/L (ref 0–55)
ANION GAP: 12 meq/L — AB (ref 3–11)
AST: 23 U/L (ref 5–34)
Alkaline Phosphatase: 70 U/L (ref 40–150)
BUN: 23.8 mg/dL (ref 7.0–26.0)
CALCIUM: 10.6 mg/dL — AB (ref 8.4–10.4)
CHLORIDE: 101 meq/L (ref 98–109)
CO2: 26 meq/L (ref 22–29)
Creatinine: 1.4 mg/dL — ABNORMAL HIGH (ref 0.6–1.1)
EGFR: 33 mL/min/{1.73_m2} — ABNORMAL LOW (ref >90)
GLUCOSE: 109 mg/dL (ref 70–140)
POTASSIUM: 3.8 meq/L (ref 3.5–5.1)
Sodium: 140 mEq/L (ref 136–145)
Total Bilirubin: 0.51 mg/dL (ref 0.20–1.20)
Total Protein: 8.2 g/dL (ref 6.4–8.3)

## 2015-05-11 LAB — CBC WITH DIFFERENTIAL/PLATELET
BASO%: 1.6 % (ref 0.0–2.0)
Basophils Absolute: 0.1 10*3/uL (ref 0.0–0.1)
EOS%: 5.4 % (ref 0.0–7.0)
Eosinophils Absolute: 0.4 10*3/uL (ref 0.0–0.5)
HCT: 42.5 % (ref 34.8–46.6)
HEMOGLOBIN: 14.3 g/dL (ref 11.6–15.9)
LYMPH#: 1.3 10*3/uL (ref 0.9–3.3)
LYMPH%: 18.1 % (ref 14.0–49.7)
MCH: 28.9 pg (ref 25.1–34.0)
MCHC: 33.7 g/dL (ref 31.5–36.0)
MCV: 85.5 fL (ref 79.5–101.0)
MONO#: 0.9 10*3/uL (ref 0.1–0.9)
MONO%: 12.3 % (ref 0.0–14.0)
NEUT#: 4.5 10*3/uL (ref 1.5–6.5)
NEUT%: 62.6 % (ref 38.4–76.8)
Platelets: 292 10*3/uL (ref 145–400)
RBC: 4.97 10*6/uL (ref 3.70–5.45)
RDW: 14.1 % (ref 11.2–14.5)
WBC: 7.3 10*3/uL (ref 3.9–10.3)

## 2015-05-11 NOTE — Progress Notes (Signed)
Ms. Debnam is a very pleasant 81 y.o. female from [place], [place] with newly diagnosed grade 1 invasive ductal carcinoma of the right breast.  Biopsy results revealed the tumor's prognostic profile is ER positive, PR positive, and HER2/neu negative. Ki67 is 40%.  She presents today with her husband to the [facility]) for treatment consideration and recommendations from the breast surgeon, radiation oncologist, and medical oncologist.     I briefly met with Ms. Kreher and her husband during her [facility] visit today. We discussed the purpose of the Survivorship Clinic, which will include monitoring for recurrence, coordinating completion of age and gender-appropriate cancer screenings, promotion of overall wellness, as well as managing potential late/long-term side effects of anti-cancer treatments.    The treatment plan for Ms. Sikkema will likely include surgery, +/- radiation therapy, and anti-estrogen therapy.  As of today, the intent of treatment for Ms. Dunlow is cure, therefore she will be eligible for the Survivorship Clinic upon her completion of treatment.  Her survivorship care plan (SCP) document will be drafted and updated throughout the course of her treatment trajectory. She will receive the SCP in an office visit with myself in the Survivorship Clinic once she has completed treatment.   Ms. Rocque was encouraged to ask questions and all questions were answered to her satisfaction.  She was given my business card and encouraged to contact me with any concerns regarding survivorship.  I look forward to participating in her care.   Mike Craze, NP [place] [phone]

## 2015-05-11 NOTE — Progress Notes (Signed)
[facility] NEW PATIENT EVALUATION  Name: Ana Mckinney MRN: [id number]  Date:   05/11/2015           DOB: 07-20-1926  Status: outpatient   CC: Ulyses Southward, MD  Excell Seltzer, MD    REFERRING PHYSICIAN: Excell Seltzer, MD   DIAGNOSIS: Stage IA (T1 N0 M0) invasive ductal carcinoma of the right breast   HISTORY OF PRESENT ILLNESS:  Ana Mckinney is a 81 y.o. female who is seen today through the courtesy of Dr. Excell Seltzer at the multidisciplinary breast clinic for evaluation of her T1 N0 invasive ductal carcinoma the right breast.  At the time of a screening mammogram at [facility] on 04/18/2015 she was felt to have a 1.6 cm mass within the right breast, upper outer quadrant.  Masses were also seen with the left breast measuring 1.5 cm in the upper outer aspect, and 2.0 cm within the lower inner aspect.  Ultrasound on 04/27/2015 showed a new mass within the right breast at 12:00.  Ultrasound showed a 2 cm irregular mass within the right upper aspect of the breast suspicious for malignancy.  There was a benign 2.1 cm cyst  at 8:00 and a benign 1.6 cm cyst in the left breast at 1:00.  Biopsy of the right breast mass on 05/03/2015 was diagnostic for invasive ductal carcinoma.  There appears to be LV I.  Her tumor was ER positive at 100%, PR +100% with an elevated Ki-67 of 40%.  HER-2/neu is pending.  She is without complaints today.  PREVIOUS RADIATION THERAPY: No   PAST MEDICAL HISTORY:  has a past medical history of Breast cancer of upper-outer quadrant of right female breast (05/06/2015); Breast cancer; Hypertension; and Glaucoma.     PAST SURGICAL HISTORY:  Past Surgical History  Procedure Laterality Date  . Abdominal hysterectomy    . Cholecystectomy    . Hernia repair    . Cataract extraction       FAMILY HISTORY: family history includes Lung cancer in her maternal aunt and maternal uncle.  Her father died from all with age/pneumonia at 52.  Her  mother died from lung cancer at 16.  No family history of breast cancer.   SOCIAL HISTORY:  reports that she has quit smoking. She does not have any smokeless tobacco history on file. She reports that she does not drink alcohol or use illicit drugs.  Married, 2 children.  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   ALLERGIES: Review of patient's allergies indicates no known allergies.   MEDICATIONS:  Current Outpatient Prescriptions  Medication Sig Dispense Refill  . amLODipine (NORVASC) 10 MG tablet Take 10 mg by mouth daily.    Marland Kitchen aspirin 325 MG EC tablet Take 325 mg by mouth daily.    . Calcium Citrate (CITRACAL PO) Take by mouth.    . conjugated estrogens (PREMARIN) vaginal cream Place 1 Applicatorful vaginally daily. 1/2 gram 2X week    . hydrochlorothiazide (HYDRODIURIL) 25 MG tablet Take 25 mg by mouth daily.    . Multiple Vitamin (MULTIVITAMIN) capsule Take 1 capsule by mouth daily.    . Multiple Vitamins-Minerals (ICAPS MV PO) Take by mouth.    . Omega-3 Fatty Acids (FISH OIL) 1000 MG CAPS Take 1 capsule by mouth daily.    . potassium chloride (KLOR-CON) 8 MEQ tablet Take 8 mEq by mouth daily.    . traZODone (DESYREL) 50 MG tablet Take 50 mg by mouth at bedtime.  No current facility-administered medications for this encounter.     REVIEW OF SYSTEMS:  See history of present illness.    PHYSICAL EXAM: Alert and oriented 81 year old white female appearing younger than her stated age. Wt Readings from Last 3 Encounters:  05/11/15 105 lb 8 oz (47.854 kg)   Temp Readings from Last 3 Encounters:  05/11/15 98.1 F (36.7 C) Oral   BP Readings from Last 3 Encounters:  05/11/15 172/71   Pulse Readings from Last 3 Encounters:  05/11/15 74   Head and neck examination: Grossly unremarkable.  Nodes: There is no palpable cervical, supraclavicular, or axillary lymphadenopathy.  Chest: Lungs clear.  Breasts: There is a biopsy wound at approximately 9:00 and I believe to be a palpable  mass measuring 2 cm at 10:00.  This may represent hematoma versus primary tumor.  Left breast without masses or lesions.  Extremities: Without edema.    LABORATORY DATA:  Lab Results  Component Value Date   WBC 7.3 05/11/2015   HGB 14.3 05/11/2015   HCT 42.5 05/11/2015   MCV 85.5 05/11/2015   PLT 292 05/11/2015   Lab Results  Component Value Date   NA 140 05/11/2015   K 3.8 05/11/2015   CL 97 01/20/2009   CO2 26 05/11/2015   Lab Results  Component Value Date   ALT 13 05/11/2015   AST 23 05/11/2015   ALKPHOS 70 05/11/2015   BILITOT 0.51 05/11/2015      IMPRESSION: Stage I A (T1 N0 M0) invasive ductal carcinoma of the right breast.  She is borderline T1/T2.  Considering that she also has LV I, I would favor a sentinel lymph node biopsy.  If she is found have pathologic stage TI N0, then she may be treated with antiestrogen therapy following surgery with no radiation.  If she has pathologic stage T2 or N1, then I would recommend radiation therapy.  We discussed the potential acute and late toxicities of radiation therapy.  We also discussed possible hypofractionated radiation therapy.   PLAN: As discussed above.  I spent 30  minutes face to face with the patient and more than 50% of that time was spent in counseling and/or coordination of care.

## 2015-05-11 NOTE — Progress Notes (Signed)
Checked in new pt with no financial concerns prior to seeing the dr.  Informed pt if chemo is part of her treatment we will contact her ins to see if auth is required and will obtain it if it is as well as contact foundations that offer copay assistance for chemo if needed.  She has my card for any questions or concerns. °

## 2015-05-11 NOTE — Patient Instructions (Signed)

## 2015-05-11 NOTE — Telephone Encounter (Signed)
Mailed calendar for August.

## 2015-05-11 NOTE — Therapy (Signed)
[place], [place], [address] Phone: [phone]   Fax:  [phone]  Physical Therapy Evaluation  Patient Details  Name: Ana Mckinney MRN: [id number] Date of Birth: 05-22-1926 Referring Provider:  Excell Seltzer, MD  Encounter Date: 05/11/2015      PT End of Session - 05/11/15 1625    Visit Number 1   Number of Visits 1   PT Start Time 1340   PT Stop Time 1405   PT Time Calculation (min) 25 min   Activity Tolerance Patient tolerated treatment well   Behavior During Therapy [facility] for tasks assessed/performed      Past Medical History  Diagnosis Date  . Breast cancer of upper-outer quadrant of right female breast 05/06/2015  . Breast cancer   . Hypertension   . Glaucoma     Past Surgical History  Procedure Laterality Date  . Abdominal hysterectomy    . Cholecystectomy    . Hernia repair    . Cataract extraction      There were no vitals filed for this visit.  Visit Diagnosis:  Breast cancer, right breast - Plan: PT plan of care cert/re-cert  Abnormal posture - Plan: PT plan of care cert/re-cert      Subjective Assessment - 05/11/15 1615    Subjective Patient was seen today for a baseline assessment of her newly diagnosed right breast cancer.   Patient is accompained by: Family member   Pertinent History Patient was diagnosed on 04/27/15 with right low grade ER/PR positive, HER2 negative breast cancer measuring 2 cm in size.   Patient Stated Goals Reduce lymphedema risk and learn post op shoulder ROM HEP   Currently in Pain? No/denies            [facility] PT Assessment - 05/11/15 0001    Assessment   Medical Diagnosis right breast cancer   Onset Date/Surgical Date 04/27/15   Hand Dominance Right   Prior Therapy none   Precautions   Precautions Other (comment)  active cancer   Restrictions   Weight Bearing Restrictions No   Balance Screen   Has the patient fallen in the  past 6 months No   Has the patient had a decrease in activity level because of a fear of falling?  No   Is the patient reluctant to leave their home because of a fear of falling?  No   Home Ecologist residence   Living Arrangements Spouse/significant other   Available Help at Discharge Family   Prior Function   Level of [place] Retired   Leisure She walks 1 mile per day (35 min)   Cognition   Overall Cognitive Status Within Functional Limits for tasks assessed   Posture/Postural Control   Posture/Postural Control Postural limitations   Postural Limitations Rounded Shoulders;Forward head;Increased thoracic kyphosis   ROM / Strength   AROM / PROM / Strength AROM;Strength   AROM   AROM Assessment Site Shoulder   Right/Left Shoulder Right;Left   Right Shoulder Extension 58 Degrees   Right Shoulder Flexion 138 Degrees   Right Shoulder ABduction 155 Degrees   Right Shoulder Internal Rotation 80 Degrees   Right Shoulder External Rotation 80 Degrees   Left Shoulder Extension 65 Degrees   Left Shoulder Flexion 140 Degrees   Left Shoulder ABduction 142 Degrees   Left Shoulder Internal Rotation 71 Degrees   Left Shoulder External Rotation 81 Degrees   Strength  Overall Strength Within functional limits for tasks performed           LYMPHEDEMA/ONCOLOGY QUESTIONNAIRE - 05/11/15 1623    Type   Cancer Type right breast cancer   Lymphedema Assessments   Lymphedema Assessments Upper extremities   Right Upper Extremity Lymphedema   10 cm Proximal to Olecranon Process 21.8 cm   Olecranon Process 20.2 cm   10 cm Proximal to Ulnar Styloid Process 16.1 cm   Just Proximal to Ulnar Styloid Process 13.3 cm   Across Hand at [employer] 16.8 cm   At [place] of 2nd Digit 5 cm   Left Upper Extremity Lymphedema   10 cm Proximal to Olecranon Process 22 cm   Olecranon Process 20.2 cm   10 cm Proximal to Ulnar Styloid Process 15.8 cm    Just Proximal to Ulnar Styloid Process 12.6 cm   Across Hand at [employer] 16.3 cm   At [place] of 2nd Digit 5 cm       Patient was instructed today in a home exercise program today for post op shoulder range of motion. These included active assist shoulder flexion in sitting, scapular retraction, wall walking with shoulder abduction, and hands behind head external rotation.  She was encouraged to do these twice a day, holding 3 seconds and repeating 5 times when permitted by her physician.           PT Education - 05/11/15 1625    Education provided Yes   Education Details Lymphedema risk reduction and post op shoulder ROM HEP   Person(s) Educated Patient;Spouse   Methods Explanation;Demonstration;Handout   Comprehension Verbalized understanding;Returned demonstration              Breast Clinic Goals - 05/11/15 1628    Patient will be able to verbalize understanding of pertinent lymphedema risk reduction practices relevant to her diagnosis specifically related to skin care.   Time 1   Period Days   Status Achieved   Patient will be able to return demonstrate and/or verbalize understanding of the post-op home exercise program related to regaining shoulder range of motion.   Time 1   Period Days   Status Achieved   Patient will be able to verbalize understanding of the importance of attending the postoperative After Breast Cancer Class for further lymphedema risk reduction education and therapeutic exercise.   Time 1   Period Days   Status Achieved              Plan - 05/11/15 1626    Clinical Impression Statement Patient was diagnosed on 04/27/15 with right low grade ER/PR positive, HER2 negative breast cancer measuring 2 cm in size.  She is planning to havea right lumpectomy with a sentinel node biopsy followed by anti-estrogen therapy.  She will need radiation if her axillary node is positive.  She may benefit from post op PT to regain shoulder ROM and  strength and reduce lymphedema risk but lives in [place], [place] so we would help her find therapy closer to home.   Pt will benefit from skilled therapeutic intervention in order to improve on the following deficits Decreased range of motion;Impaired UE functional use;Pain;Decreased knowledge of precautions;Decreased strength   Rehab Potential Excellent   Clinical Impairments Affecting Rehab Potential none   PT Frequency One time visit   PT Treatment/Interventions Patient/family education;Therapeutic exercise   Consulted and Agree with Plan of Care Patient;Family member/caregiver   Family Member Consulted husband     Patient  will follow up at outpatient cancer rehab if needed following surgery.  If the patient requires physical therapy at that time, a specific plan will be dictated and sent to the referring physician for approval. The patient was educated today on appropriate basic range of motion exercises to begin post operatively and the importance of attending the After Breast Cancer class following surgery.  Patient was educated today on lymphedema risk reduction practices as it pertains to recommendations that will benefit the patient immediately following surgery.  She verbalized good understanding.  No additional physical therapy is indicated at this time.         G-Codes - 05-16-2015 1629    Functional Assessment Tool Used Clinical Judgement   Functional Limitation Self care   Self Care Current Status [phone]) At least 1 percent but less than 20 percent impaired, limited or restricted   Self Care Goal Status (H0388) At least 1 percent but less than 20 percent impaired, limited or restricted   Self Care Discharge Status [phone]) At least 1 percent but less than 20 percent impaired, limited or restricted       Problem List Patient Active Problem List   Diagnosis Date Noted  . Breast cancer of upper-outer quadrant of right female breast 05/06/2015    Annia Friendly, PT 05-16-15,  4:30 PM  [place], [place], [address] Phone: [phone]   Fax:  [phone]

## 2015-05-12 ENCOUNTER — Encounter: Payer: Self-pay | Admitting: General Practice

## 2015-05-12 NOTE — Progress Notes (Signed)
[place] Psychosocial Distress Screening Spiritual Care  Spiritual Care was referred by distress screening protocol.  The patient scored a 5 on the Psychosocial Distress Thermometer which indicates moderate distress. Followed up by phone to assess for distress and other psychosocial needs.   ONCBCN DISTRESS SCREENING 05/12/2015  Screening Type Initial Screening  Distress experienced in past week (1-10) 5  Emotional problem type Nervousness/Anxiety  Information Concerns Type Lack of info about treatment  Referral to support programs Yes   Ana Mckinney was in good spirits this afternoon, relieved that she got "the best news [she] could get" at [facility] yesterday.  "We were treated beautifully at [facility]," she adds.  Dx/tx plan and confidence in her care team have decreased anxiety considerably.  She is aware of ongoing chaplain availability for support, but please also page as needs arise. Thank you.  Follow up needed: No.    Suzette Battiest, [place] Pager [phone] VM [phone]

## 2015-05-16 ENCOUNTER — Telehealth: Payer: Self-pay | Admitting: *Deleted

## 2015-05-16 ENCOUNTER — Other Ambulatory Visit: Payer: Self-pay | Admitting: Oncology

## 2015-05-16 NOTE — Progress Notes (Unsigned)
Bone density at [facility] 09/07/2014 showed a T score of -1.4 at the right femoral neck. This is a decrease as compared to 08/11/2010

## 2015-05-16 NOTE — Telephone Encounter (Signed)
Left vm for pt to return call regarding [place] from 05/11/15

## 2015-05-23 ENCOUNTER — Telehealth: Payer: Self-pay | Admitting: *Deleted

## 2015-05-23 NOTE — Telephone Encounter (Signed)
Spoke to pt concerning [place] from 05/11/15. Denies questions or concerns regarding dx or treatment care plan. Confirmed surgery date and f/u appts. Encourage pt to call with needs. Received verbal understanding. Contact information given.

## 2015-05-23 NOTE — Telephone Encounter (Signed)
-----   Message from Mauro Kaufmann, RN sent at 05/16/2015 11:33 AM EDT ----- Regarding: [place] Team,  Ms Gillentine was seen in clinic on 7/6. She is scheduled for the following.  Surgery 7/25 F/u with Dr. Jana Hakim 8/15  Please let me know if you have questions.  Thanks, [employer]

## 2015-05-25 ENCOUNTER — Encounter (HOSPITAL_BASED_OUTPATIENT_CLINIC_OR_DEPARTMENT_OTHER): Payer: Self-pay | Admitting: *Deleted

## 2015-05-26 ENCOUNTER — Other Ambulatory Visit: Payer: Self-pay

## 2015-05-26 ENCOUNTER — Encounter (HOSPITAL_BASED_OUTPATIENT_CLINIC_OR_DEPARTMENT_OTHER)
Admission: RE | Admit: 2015-05-26 | Discharge: 2015-05-26 | Disposition: A | Discharge status: Home / Self Care | Payer: Medicare HMO | Source: Ambulatory Visit | Attending: General Surgery | Admitting: General Surgery

## 2015-05-26 DIAGNOSIS — C50411 Malignant neoplasm of upper-outer quadrant of right female breast: Secondary | ICD-10-CM | POA: Diagnosis not present

## 2015-05-26 DIAGNOSIS — Z01810 Encounter for preprocedural cardiovascular examination: Secondary | ICD-10-CM | POA: Insufficient documentation

## 2015-05-26 NOTE — Progress Notes (Signed)
EKG reviewed by Dr Kalman Shan and cleared

## 2015-05-30 ENCOUNTER — Ambulatory Visit (HOSPITAL_BASED_OUTPATIENT_CLINIC_OR_DEPARTMENT_OTHER): Payer: Medicare HMO | Admitting: Certified Registered"

## 2015-05-30 ENCOUNTER — Encounter (HOSPITAL_COMMUNITY)
Admission: RE | Admit: 2015-05-30 | Discharge: 2015-05-30 | Disposition: A | Discharge status: Home / Self Care | Payer: Medicare HMO | Source: Ambulatory Visit | Attending: General Surgery | Admitting: General Surgery

## 2015-05-30 ENCOUNTER — Encounter (HOSPITAL_BASED_OUTPATIENT_CLINIC_OR_DEPARTMENT_OTHER)
Admission: RE | Disposition: A | Discharge status: Home / Self Care | Payer: Self-pay | Source: Ambulatory Visit | Attending: General Surgery

## 2015-05-30 ENCOUNTER — Ambulatory Visit (HOSPITAL_BASED_OUTPATIENT_CLINIC_OR_DEPARTMENT_OTHER)
Admission: RE | Admit: 2015-05-30 | Discharge: 2015-05-30 | Disposition: A | Discharge status: Home / Self Care | Payer: Medicare HMO | Source: Ambulatory Visit | Attending: General Surgery | Admitting: General Surgery

## 2015-05-30 ENCOUNTER — Encounter (HOSPITAL_BASED_OUTPATIENT_CLINIC_OR_DEPARTMENT_OTHER): Payer: Self-pay | Admitting: *Deleted

## 2015-05-30 DIAGNOSIS — I517 Cardiomegaly: Secondary | ICD-10-CM | POA: Insufficient documentation

## 2015-05-30 DIAGNOSIS — Z79899 Other long term (current) drug therapy: Secondary | ICD-10-CM | POA: Insufficient documentation

## 2015-05-30 DIAGNOSIS — C773 Secondary and unspecified malignant neoplasm of axilla and upper limb lymph nodes: Secondary | ICD-10-CM | POA: Insufficient documentation

## 2015-05-30 DIAGNOSIS — C50411 Malignant neoplasm of upper-outer quadrant of right female breast: Secondary | ICD-10-CM | POA: Insufficient documentation

## 2015-05-30 DIAGNOSIS — Z171 Estrogen receptor negative status [ER-]: Secondary | ICD-10-CM | POA: Diagnosis not present

## 2015-05-30 DIAGNOSIS — I10 Essential (primary) hypertension: Secondary | ICD-10-CM | POA: Insufficient documentation

## 2015-05-30 DIAGNOSIS — I447 Left bundle-branch block, unspecified: Secondary | ICD-10-CM | POA: Diagnosis not present

## 2015-05-30 DIAGNOSIS — N6011 Diffuse cystic mastopathy of right breast: Secondary | ICD-10-CM | POA: Diagnosis not present

## 2015-05-30 DIAGNOSIS — N6091 Unspecified benign mammary dysplasia of right breast: Secondary | ICD-10-CM | POA: Insufficient documentation

## 2015-05-30 DIAGNOSIS — R9431 Abnormal electrocardiogram [ECG] [EKG]: Secondary | ICD-10-CM | POA: Diagnosis not present

## 2015-05-30 DIAGNOSIS — I493 Ventricular premature depolarization: Secondary | ICD-10-CM | POA: Diagnosis not present

## 2015-05-30 DIAGNOSIS — R921 Mammographic calcification found on diagnostic imaging of breast: Secondary | ICD-10-CM | POA: Insufficient documentation

## 2015-05-30 DIAGNOSIS — Z87891 Personal history of nicotine dependence: Secondary | ICD-10-CM | POA: Diagnosis not present

## 2015-05-30 DIAGNOSIS — Z7982 Long term (current) use of aspirin: Secondary | ICD-10-CM | POA: Diagnosis not present

## 2015-05-30 DIAGNOSIS — Q150 Congenital glaucoma: Secondary | ICD-10-CM | POA: Diagnosis not present

## 2015-05-30 HISTORY — PX: BREAST LUMPECTOMY WITH RADIOACTIVE SEED AND SENTINEL LYMPH NODE BIOPSY: SHX6550

## 2015-05-30 HISTORY — DX: Other specified postprocedural states: Z98.890

## 2015-05-30 HISTORY — DX: Other specified postprocedural states: R11.2

## 2015-05-30 LAB — POCT HEMOGLOBIN-HEMACUE: Hemoglobin: 13.2 g/dL (ref 12.0–15.0)

## 2015-05-30 SURGERY — BREAST LUMPECTOMY WITH RADIOACTIVE SEED AND SENTINEL LYMPH NODE BIOPSY
Anesthesia: Regional | Site: Breast | Laterality: Right

## 2015-05-30 MED ORDER — SODIUM CHLORIDE 0.9 % IJ SOLN
INTRAMUSCULAR | Status: DC | PRN
  Administered 2015-05-30: 5 mL

## 2015-05-30 MED ORDER — FENTANYL CITRATE (PF) 100 MCG/2ML IJ SOLN
INTRAMUSCULAR | Status: AC
  Filled 2015-05-30: qty 6

## 2015-05-30 MED ORDER — FENTANYL CITRATE (PF) 100 MCG/2ML IJ SOLN
INTRAMUSCULAR | Status: AC
  Filled 2015-05-30: qty 2

## 2015-05-30 MED ORDER — PROPOFOL 10 MG/ML IV BOLUS
INTRAVENOUS | Status: DC | PRN
Start: 2015-05-30 — End: 2015-05-30
  Administered 2015-05-30: 50 mg via INTRAVENOUS

## 2015-05-30 MED ORDER — LIDOCAINE HCL (CARDIAC) 20 MG/ML IV SOLN
INTRAVENOUS | Status: DC | PRN
  Administered 2015-05-30: 20 mg via INTRAVENOUS

## 2015-05-30 MED ORDER — CHLORHEXIDINE GLUCONATE 4 % EX LIQD: 1.0000 "application " | Freq: Once | CUTANEOUS | Status: DC

## 2015-05-30 MED ORDER — ONDANSETRON HCL 4 MG/2ML IJ SOLN
INTRAMUSCULAR | Status: DC | PRN
  Administered 2015-05-30: 4 mg via INTRAVENOUS

## 2015-05-30 MED ORDER — METHYLENE BLUE 1 % INJ SOLN
INTRAMUSCULAR | Status: AC
  Filled 2015-05-30: qty 10

## 2015-05-30 MED ORDER — MIDAZOLAM HCL 2 MG/2ML IJ SOLN: 1.0000 mg | INTRAMUSCULAR | Status: DC | PRN

## 2015-05-30 MED ORDER — MIDAZOLAM HCL 2 MG/2ML IJ SOLN
INTRAMUSCULAR | Status: AC
  Filled 2015-05-30: qty 2

## 2015-05-30 MED ORDER — SCOPOLAMINE 1 MG/3DAYS TD PT72: 1.0000 | MEDICATED_PATCH | Freq: Once | TRANSDERMAL | Status: DC | PRN

## 2015-05-30 MED ORDER — MEPERIDINE HCL 25 MG/ML IJ SOLN
6.2500 mg | INTRAMUSCULAR | Status: DC | PRN
Start: 2015-05-30 — End: 2015-05-30

## 2015-05-30 MED ORDER — DEXAMETHASONE SODIUM PHOSPHATE 4 MG/ML IJ SOLN
INTRAMUSCULAR | Status: DC | PRN
  Administered 2015-05-30: 10 mg via INTRAVENOUS

## 2015-05-30 MED ORDER — BUPIVACAINE-EPINEPHRINE (PF) 0.5% -1:200000 IJ SOLN
INTRAMUSCULAR | Status: DC | PRN
  Administered 2015-05-30: 23 mL via PERINEURAL

## 2015-05-30 MED ORDER — FENTANYL CITRATE (PF) 100 MCG/2ML IJ SOLN
50.0000 ug | INTRAMUSCULAR | Status: DC | PRN
  Administered 2015-05-30 (×2): 50 ug via INTRAVENOUS

## 2015-05-30 MED ORDER — GLYCOPYRROLATE 0.2 MG/ML IJ SOLN: 0.2000 mg | Freq: Once | INTRAMUSCULAR | Status: DC | PRN

## 2015-05-30 MED ORDER — BUPIVACAINE-EPINEPHRINE (PF) 0.25% -1:200000 IJ SOLN
INTRAMUSCULAR | Status: DC | PRN
  Administered 2015-05-30: 6 mL

## 2015-05-30 MED ORDER — HYDROCODONE-ACETAMINOPHEN 5-325 MG PO TABS: 1.0000 | ORAL_TABLET | ORAL | Status: AC | PRN

## 2015-05-30 MED ORDER — EPHEDRINE SULFATE 50 MG/ML IJ SOLN
INTRAMUSCULAR | Status: DC | PRN
  Administered 2015-05-30: 10 mg via INTRAVENOUS

## 2015-05-30 MED ORDER — CEFAZOLIN SODIUM-DEXTROSE 2-3 GM-% IV SOLR
INTRAVENOUS | Status: AC
  Filled 2015-05-30: qty 50

## 2015-05-30 MED ORDER — CEFAZOLIN SODIUM-DEXTROSE 2-3 GM-% IV SOLR
2.0000 g | INTRAVENOUS | Status: AC
  Administered 2015-05-30: 2 g via INTRAVENOUS

## 2015-05-30 MED ORDER — FENTANYL CITRATE (PF) 100 MCG/2ML IJ SOLN
25.0000 ug | INTRAMUSCULAR | Status: DC | PRN
  Administered 2015-05-30: 25 ug via INTRAVENOUS

## 2015-05-30 MED ORDER — TECHNETIUM TC 99M SULFUR COLLOID FILTERED
1.0000 | Freq: Once | INTRAVENOUS | Status: AC | PRN
  Administered 2015-05-30: 1 via INTRADERMAL

## 2015-05-30 MED ORDER — LACTATED RINGERS IV SOLN
INTRAVENOUS | Status: DC
  Administered 2015-05-30 (×2): via INTRAVENOUS

## 2015-05-30 MED ORDER — SODIUM CHLORIDE 0.9 % IJ SOLN
INTRAMUSCULAR | Status: AC
  Filled 2015-05-30: qty 10

## 2015-05-30 SURGICAL SUPPLY — 51 items
APPLIER CLIP 9.375 MED OPEN (MISCELLANEOUS) ×2
APR CLP MED 9.3 20 MLT OPN (MISCELLANEOUS) ×1
BINDER BREAST LRG (GAUZE/BANDAGES/DRESSINGS) IMPLANT
BINDER BREAST MEDIUM (GAUZE/BANDAGES/DRESSINGS) ×1 IMPLANT
BINDER BREAST XLRG (GAUZE/BANDAGES/DRESSINGS) IMPLANT
BINDER BREAST XXLRG (GAUZE/BANDAGES/DRESSINGS) IMPLANT
BLADE SURG 15 STRL LF DISP TIS (BLADE) ×1 IMPLANT
BLADE SURG 15 STRL SS (BLADE) ×2
CANISTER SUC SOCK COL 7IN (MISCELLANEOUS) IMPLANT
CANISTER SUCT 1200ML W/VALVE (MISCELLANEOUS) IMPLANT
CHLORAPREP W/TINT 26ML (MISCELLANEOUS) ×2 IMPLANT
CLIP APPLIE 9.375 MED OPEN (MISCELLANEOUS) ×1 IMPLANT
COVER BACK TABLE 60X90IN (DRAPES) ×2 IMPLANT
COVER MAYO STAND STRL (DRAPES) ×2 IMPLANT
COVER PROBE W GEL 5X96 (DRAPES) ×2 IMPLANT
DECANTER SPIKE VIAL GLASS SM (MISCELLANEOUS) IMPLANT
DEVICE DUBIN W/COMP PLATE 8390 (MISCELLANEOUS) ×2 IMPLANT
DRAPE LAPAROSCOPIC ABDOMINAL (DRAPES) ×2 IMPLANT
DRAPE UTILITY XL STRL (DRAPES) ×2 IMPLANT
ELECT COATED BLADE 2.86 ST (ELECTRODE) ×2 IMPLANT
ELECT REM PT RETURN 9FT ADLT (ELECTROSURGICAL) ×2
ELECTRODE REM PT RTRN 9FT ADLT (ELECTROSURGICAL) ×1 IMPLANT
GLOVE BIOGEL PI IND STRL 6.5 (GLOVE) IMPLANT
GLOVE BIOGEL PI IND STRL 8 (GLOVE) ×1 IMPLANT
GLOVE BIOGEL PI INDICATOR 6.5 (GLOVE) ×2
GLOVE BIOGEL PI INDICATOR 8 (GLOVE) ×1
GLOVE ECLIPSE 6.5 STRL STRAW (GLOVE) ×1 IMPLANT
GLOVE ECLIPSE 7.5 STRL STRAW (GLOVE) ×2 IMPLANT
GOWN STRL REUS W/ TWL LRG LVL3 (GOWN DISPOSABLE) ×1 IMPLANT
GOWN STRL REUS W/ TWL XL LVL3 (GOWN DISPOSABLE) ×1 IMPLANT
GOWN STRL REUS W/TWL LRG LVL3 (GOWN DISPOSABLE) ×2
GOWN STRL REUS W/TWL XL LVL3 (GOWN DISPOSABLE) ×2
KIT MARKER MARGIN INK (KITS) ×2 IMPLANT
LIQUID BAND (GAUZE/BANDAGES/DRESSINGS) ×2 IMPLANT
NDL HYPO 25X1 1.5 SAFETY (NEEDLE) ×1 IMPLANT
NDL SAFETY ECLIPSE 18X1.5 (NEEDLE) IMPLANT
NEEDLE HYPO 18GX1.5 SHARP (NEEDLE) ×4
NEEDLE HYPO 25X1 1.5 SAFETY (NEEDLE) ×4 IMPLANT
NS IRRIG 1000ML POUR BTL (IV SOLUTION) ×1 IMPLANT
PACK BASIN DAY SURGERY FS (CUSTOM PROCEDURE TRAY) ×2 IMPLANT
PENCIL BUTTON HOLSTER BLD 10FT (ELECTRODE) ×2 IMPLANT
SLEEVE SCD COMPRESS KNEE MED (MISCELLANEOUS) ×2 IMPLANT
SPONGE LAP 4X18 X RAY DECT (DISPOSABLE) ×2 IMPLANT
SUT MON AB 5-0 PS2 18 (SUTURE) ×2 IMPLANT
SUT SILK 2 0 SH (SUTURE) IMPLANT
SUT VICRYL 3-0 CR8 SH (SUTURE) ×2 IMPLANT
SYR CONTROL 10ML LL (SYRINGE) ×3 IMPLANT
TOWEL OR 17X24 6PK STRL BLUE (TOWEL DISPOSABLE) ×2 IMPLANT
TOWEL OR NON WOVEN STRL DISP B (DISPOSABLE) ×1 IMPLANT
TUBE CONNECTING 20X1/4 (TUBING) IMPLANT
YANKAUER SUCT BULB TIP NO VENT (SUCTIONS) IMPLANT

## 2015-05-30 NOTE — Anesthesia Postprocedure Evaluation (Signed)
  Anesthesia Post-op Note  Patient: Ana Mckinney  Procedure(s) Performed: Procedure(s): RADIOACTIVE SEED LOCALIZED RIGHT BREAST LUMPECTOMY AND RIGHT AXILLARY SENTINEL LYMPH NODE BIOPSY (Right)  Patient Location: PACU  Anesthesia Type: General, Regional   Level of Consciousness: awake, alert  and oriented  Airway and Oxygen Therapy: Patient Spontanous Breathing  Post-op Pain: none  Post-op Assessment: Post-op Vital signs reviewed  Post-op Vital Signs: Reviewed  Last Vitals:  Filed Vitals:   05/30/15 0950  BP: 155/81  Pulse: 90  Temp:   Resp: 18    Complications: No apparent anesthesia complications

## 2015-05-30 NOTE — H&P (Signed)
History of Present Illness Ana Kitchen T. Kellyjo Edgren MD; 05/11/2015 4:42 PM) The patient is a 81 year old female who presents with breast cancer. She is an 81 YO postmenopausal female referred by Dr. Marcelo Mckinney for evaluation of recently diagnosed carcinoma of the right breast. She recently presented for a screening mamogram revealing a suspicious mass in the upper outer quadrant of the right breast. Subsequent imaging included diagnostic mamogram confirming an irregular mass and ultrasound showing a 2 cm irregular mass in the upper outer right breast middle depth. 2 benign cysts were seen in the left breast. An ultrasound guided breast biopsy was performed on 05/03/2015 with pathology revealing iinvasive ductal carcinoma of the breast. She is seen now in [facility] for initial treatment planning. She has experienced no breast symptoms, specifically lump or pain or nipple discharge.. She does have a previous history of 2 benign masses removed from the left breast and known dense breast tissue.  Findings at that time were the following: Tumor size: 2 cm Tumor grade: 1 Estrogen Receptor: positive Progesterone Receptor: positive Her-2 neu: negative Lymph node status: negative    Problem List/Past Medical Ana Jolly, MD; 05/11/2015 4:46 PM) HTN (HYPERTENSION), BENIGN (401.1  I10) GLAUCOMA, AXENFELD (365.41  Q15.9)  Past Surgical History Ana Jolly, MD; 05/11/2015 4:48 PM) Hysterectomy Cataract Surgery Cholecystectomy Discectomy Breast Biopsy - Left  Allergies Ana Jolly, MD; 05/11/2015 4:54 PM) No Known Drug Allergies07/04/2015  Medication History Ana Jolly, MD; 05/11/2015 4:51 PM) AmLODIPine Besylate (10MG Tablet, Oral) Active. Aspirin (325MG Tablet, Oral daily) Active. Hydrochlorothiazide (25MG Tablet, Oral daily) Active. TraZODone HCl (50MG Tablet, Oral) Active.  Physical Exam Ana Kitchen T. Ana Marter MD; 05/11/2015 2:04 PM) The physical exam  findings are as follows: Note:General: Alert, thin elderly but healthy-appearing Caucasian female, in no distress Skin: Warm and dry without rash or infection. HEENT: No palpable masses or thyromegaly. Sclera nonicteric. Pupils equal round and reactive. Oropharynx clear. Lymph nodes: No cervical, supraclavicular, or inguinal nodes palpable. Breasts: there is a 1-2 cm palpable massf the right breastng in the upper outer quadrant of the right breast, possible mass or postbiopsy change. No other masses in the right breast. There are some palpable irregularities in the left breast with a mass just medial to the nipple that corresponds to a simple cyst seen on the ultrasound. Lungs: Breath sounds clear and equal. No wheezing or increased work of breathing. Cardiovascular: Regular rate and rhythm without murmer. No JVD or edema. Peripheral pulses intact. No carotid bruits. Abdomen: Nondistended. Soft and nontender. No masses palpable. No organomegaly. No palpable hernias. ell-healed upper midline incision Extremities: No edema or joint swelling or deformity. No chronic venous stasis changes. Neurologic: Alert and fully oriented. Gait normal. No focal weakness. Psychiatric: Normal mood and affect. Thought content appropriate with normal judgement and insight    Assessment & Plan Ana Kitchen T. Ana Muench MD; 05/11/2015 4:57 PM) MALIGNANT NEOPLASM OF UPPER-OUTER QUADRANT OF RIGHT FEMALE BREAST (174.4  C50.411) Impression: 81 year old female with a new diagnosis of cancer of the right breast, upper outer quadrant. Clinical stage 1A, ER pos, PR pos, HER-2 neg. I discussed with the patient and family members present today initial surgical treatment options. We discussed options of breast conservation with lumpectomy or total mastectomy and sentinal lymph node biopsy/dissection. Options for reconstruction were discussed. After discussion they have elected to proceed with breast conservation with lumpectomy and  axillary sentinel lymph node biopsy. We will plan lymph node biopsy as this could influence decisions regarding  radiation. We discussed the indications and nature of the procedure, and expected recovery, in detail. Surgical risks including anesthetic complications, cardiorespiratory complications, bleeding, infection, wound healing complications, blood clots, lymphedema, local and distant recurrence and possible need for further surgery based on the final pathology was discussed and understood. Chemotherapy, hormonal therapy and radiation therapy have been discussed. They have been provided with literature regarding the treatment of breast cancer. All questions were answered. They understand and agree to proceed and we will go ahead with scheduling. Current Plans  Schedule for Surgery radioactive seed localized right breast lumpectomy with right axillary sentinel lymph node biopsy under general anesthesia as an outpatient

## 2015-05-30 NOTE — Transfer of Care (Signed)
Immediate Anesthesia Transfer of Care Note  Patient: Ana Mckinney  Procedure(s) Performed: Procedure(s): RADIOACTIVE SEED LOCALIZED RIGHT BREAST LUMPECTOMY AND RIGHT AXILLARY SENTINEL LYMPH NODE BIOPSY (Right)  Patient Location: PACU  Anesthesia Type:GA combined with regional for post-op pain  Level of Consciousness: awake and patient cooperative  Airway & Oxygen Therapy: Patient Spontanous Breathing and Patient connected to face mask oxygen  Post-op Assessment: Report given to RN and Post -op Vital signs reviewed and stable  Post vital signs: Reviewed and stable  Last Vitals:  Filed Vitals:   05/30/15 0635  BP: 164/76  Pulse: 73  Temp: 36.4 C  Resp: 16    Complications: No apparent anesthesia complications

## 2015-05-30 NOTE — Anesthesia Preprocedure Evaluation (Addendum)
Anesthesia Evaluation  Patient identified by MRN, date of birth, ID band Patient awake and Patient confused    Reviewed: Allergy & Precautions, NPO status   History of Anesthesia Complications (+) PONV  Airway Mallampati: I  TM Distance: >3 FB Neck ROM: Full    Dental  (+) Upper Dentures, Dental Advisory Given   Pulmonary former smoker,  breath sounds clear to auscultation        Cardiovascular hypertension, Pt. on medications Rhythm:Regular Rate:Normal     Neuro/Psych    GI/Hepatic   Endo/Other    Renal/GU      Musculoskeletal   Abdominal   Peds  Hematology   Anesthesia Other Findings   Reproductive/Obstetrics                            Anesthesia Physical Anesthesia Plan  ASA: II  Anesthesia Plan: General and Regional   Post-op Pain Management:    Induction: Intravenous  Airway Management Planned: LMA  Additional Equipment:   Intra-op Plan:   Post-operative Plan: Extubation in OR  Informed Consent: I have reviewed the patients History and Physical, chart, labs and discussed the procedure including the risks, benefits and alternatives for the proposed anesthesia with the patient or authorized representative who has indicated his/her understanding and acceptance.   Dental advisory given  Plan Discussed with: CRNA, Anesthesiologist and Surgeon  Anesthesia Plan Comments:         Anesthesia Quick Evaluation

## 2015-05-30 NOTE — Interval H&P Note (Signed)
History and Physical Interval Note:  05/30/2015 7:22 AM  Ana Mckinney  has presented today for surgery, with the diagnosis of cancer of the right upper quadrant right breast  The various methods of treatment have been discussed with the patient and family. After consideration of risks, benefits and other options for treatment, the patient has consented to  Procedure(s): RADIOACTIVE SEED LOCALIZED RIGHT BREAST LUMPECTOMY AND RIGHT AXILLARY SENTINEL LYMPH NODE BIOPSY (Right) as a surgical intervention .  The patient's history has been reviewed, patient examined, no change in status, stable for surgery.  I have reviewed the patient's chart and labs.  Questions were answered to the patient's satisfaction.     Sofiya Ezelle T

## 2015-05-30 NOTE — Progress Notes (Signed)
2Assisted Dr. Al Corpus with right, ultrasound guided, pectoralis block. Side rails up, monitors on throughout procedure. See vital signs in flow sheet. Tolerated Procedure well.

## 2015-05-30 NOTE — Op Note (Signed)
Preoperative Diagnosis: cancer of the right upper quadrant right breast  Postoprative Diagnosis: cancer of the right upper quadrant right breast  Procedure: Procedure(s): Blue dye injection right breast, RADIOACTIVE SEED LOCALIZED RIGHT BREAST LUMPECTOMY AND RIGHT AXILLARY SENTINEL LYMPH NODE BIOPSY   Surgeon: Excell Seltzer T   Assistants: none  Anesthesia:  General LMA anesthesia  Indications: patient 81 year old female with a recent diagnosis of clinical stage IA invasive ductal carcinoma of the right breast, 2 cm primary ER/PR positive tumor. After discussion regarding treatment options detailed extensively elsewhere with electric proceed with radioactive seed localized lumpectomy and axillary sentinel lymph node biopsy is initial surgical therapy.    Procedure Detail:  The patient previously undergone placement of a radioactive seed at the tumor site. In the holding area 1 mCi of technetium sulfur colloid was injected intradermally around the right nipple prior the procedure. Patient was taken operating room, placed in supine position on the operating table, laryngeal mask general anesthesia induced. Under sterile technique after patient timeout was performed I injected 5 mL of dilute methylene blue subcutaneously beneath the right nipple and massaged this for several minutes. The entire right chest and axilla and breast were widely sterilely prepped and draped. She received preoperative IV antibiotics. PAS ring placed. Patient timeout was again performed and correct procedure verified. The neoprobe was used to localize the seed in the upper outer right breast. A curvilinear incision was made and dissection carried down into the subcutaneous tissue. Using the neoprobe for guidance short skin and subcutaneous flaps were raised. A generous portion of breast tissue was then excised with cautery around the seed using the neoprobe for localization. There was a palpable mass associated. This was  completely excised with grossly normal margins. The specimen was inked for margins. Specimen x-ray showed the seed and the clip centrally located within the specimen although the marking clip was a little bit to the lateral aspect. I excised an additional 5 mm lateral margin which was sent as a separate specimen and oriented. Hemostasis was obtained after the wound was irrigated. The lumpectomy cavity was marked with clips. Breast the subcutaneous Tissue was closed with interrupted 3-0 Vicryl. Attention was turned to the sentinel lymph node biopsy. The neoprobe was used to localize a hot area in the right axilla. A small transverse incision was made and dissection carried down through separating this tissue with cautery. Clavipectoral fascia was carefully opened. Using the neoprobe for guidance I dissected down onto a small slightly firm lymph node with high counts in bright blue dye. This was completely excised and ex vivo had counts of several 100 with counts in the axilla essentially nil. This was sent as a hot blue right axillary sentinel lymph node. Hemostasis was assured and the deep subcutaneous stitch was closed with interrupted 3-0 Vicryl. Marcaine was infiltrated in both skin incisions and both incisions closed with subcuticular 5-0 Monocryl and Dermabond. Sponge needle and instrument counts were correct.    Findings: As above  Estimated Blood Loss:  Minimal         Drains: none  Blood Given: none          Specimens: #1 right breast lumpectomy    #2 further lateral margin, oriented     #3 right axillary soft tissue possible nodal   #4 hot blue right axillary sentinel lymph node        Complications:  * No complications entered in OR log *         Disposition:  PACU - hemodynamically stable.         Condition: stable

## 2015-05-30 NOTE — Discharge Instructions (Signed)
[place] Office Phone Number [phone]  BREAST BIOPSY/LUMPECTOY: POST OP INSTRUCTIONS  Always review your discharge instruction sheet given to you by the facility where your surgery was performed.  IF YOU HAVE DISABILITY OR FAMILY LEAVE FORMS, YOU MUST BRING THEM TO THE OFFICE FOR PROCESSING.  DO NOT GIVE THEM TO YOUR DOCTOR.  1. A prescription for pain medication may be given to you upon discharge.  Take your pain medication as prescribed, if needed.  If narcotic pain medicine is not needed, then you may take acetaminophen (Tylenol) or ibuprofen (Advil) as needed. 2. Take your usually prescribed medications unless otherwise directed 3. If you need a refill on your pain medication, please contact your pharmacy.  They will contact our office to request authorization.  Prescriptions will not be filled after 5pm or on week-ends. 4. You should eat very light the first 24 hours after surgery, such as soup, crackers, pudding, etc.  Resume your normal diet the day after surgery. 5. Most patients will experience some swelling and bruising in the breast.  Ice packs and a good support bra will help.  Swelling and bruising can take several days to resolve.  6. It is common to experience some constipation if taking pain medication after surgery.  Increasing fluid intake and taking a stool softener will usually help or prevent this problem from occurring.  A mild laxative (Milk of Magnesia or Miralax) should be taken according to package directions if there are no bowel movements after 48 hours. 7. Unless discharge instructions indicate otherwise, you may remove your bandages 24-48 hours after surgery, and you may shower at that time.  You may have steri-strips (small skin tapes) in place directly over the incision.  These strips should be left on the skin for 7-10 days.  If your surgeon used skin glue on the incision, you may shower in 24 hours.  The glue will flake off over the next 2-3  weeks.  Any sutures or staples will be removed at the office during your follow-up visit. 8. ACTIVITIES:  You may resume regular daily activities (gradually increasing) beginning the next day.  Wearing a good support bra or sports bra minimizes pain and swelling.  You may have sexual intercourse when it is comfortable. a. You may drive when you no longer are taking prescription pain medication, you can comfortably wear a seatbelt, and you can safely maneuver your car and apply brakes. b. RETURN TO WORK:  ______________________________________________________________________________________ 9. You should see your doctor in the office for a follow-up appointment approximately two weeks after your surgery.  Your doctors nurse will typically make your follow-up appointment when she calls you with your pathology report.  Expect your pathology report 2-3 business days after your surgery.  You may call to check if you do not hear from [place] after three days. 10. OTHER INSTRUCTIONS: _______________________________________________________________________________________________ _____________________________________________________________________________________________________________________________________ _____________________________________________________________________________________________________________________________________ _____________________________________________________________________________________________________________________________________  WHEN TO CALL YOUR DOCTOR: 1. Fever over 101.0 2. Nausea and/or vomiting. 3. Extreme swelling or bruising. 4. Continued bleeding from incision. 5. Increased pain, redness, or drainage from the incision.  The clinic staff is available to answer your questions during regular business hours.  Please dont hesitate to call and ask to speak to one of the nurses for clinical concerns.  If you have a medical emergency, go to the nearest emergency  room or call 911.  A surgeon from [facility] is always on call at the hospital.  For further questions, please visit [email or web address]  Post Anesthesia Home Care Instructions ° °Activity: °Get plenty of rest for the remainder of the day. A responsible adult should stay with you for 24 hours following the procedure.  °For the next 24 hours, DO NOT: °-Drive a car °-Operate machinery °-Drink alcoholic beverages °-Take any medication unless instructed by your physician °-Make any legal decisions or sign important papers. ° °Meals: °Start with liquid foods such as gelatin or soup. Progress to regular foods as tolerated. Avoid greasy, spicy, heavy foods. If nausea and/or vomiting occur, drink only clear liquids until the nausea and/or vomiting subsides. Call your physician if vomiting continues. ° °Special Instructions/Symptoms: °Your throat may feel dry or sore from the anesthesia or the breathing tube placed in your throat during surgery. If this causes discomfort, gargle with warm salt water. The discomfort should disappear within 24 hours. ° °If you had a scopolamine patch placed behind your ear for the management of post- operative nausea and/or vomiting: ° °1. The medication in the patch is effective for 72 hours, after which it should be removed.  Wrap patch in a tissue and discard in the trash. Wash hands thoroughly with soap and water. °2. You may remove the patch earlier than 72 hours if you experience unpleasant side effects which may include dry mouth, dizziness or visual disturbances. °3. Avoid touching the patch. Wash your hands with soap and water after contact with the patch. °  ° °

## 2015-05-30 NOTE — Anesthesia Procedure Notes (Addendum)
Anesthesia Regional Block:  Pectoralis block  Pre-Anesthetic Checklist: ,, timeout performed, Correct Patient, Correct Site, Correct Laterality, Correct Procedure, Correct Position, site marked, Risks and benefits discussed,  Surgical consent,  Pre-op evaluation,  At surgeon's request and post-op pain management  Laterality: Right and Upper  Prep: chloraprep       Needles:  Injection technique: Single-shot  Needle Type: Echogenic Needle     Needle Length: 9cm 9 cm Needle Gauge: 21 and 21 G    Additional Needles:  Procedures: ultrasound guided (picture in chart) Pectoralis block Narrative:  Start time: 05/30/2015 7:10 AM End time: 05/30/2015 7:14 AM Injection made incrementally with aspirations every 5 mL.  Performed by: Personally  Anesthesiologist: CREWS, DAVID   Procedure Name: LMA Insertion Date/Time: 05/30/2015 7:40 AM Performed by: Furman Trentman D Pre-anesthesia Checklist: Patient identified, Emergency Drugs available, Suction available and Patient being monitored Patient Re-evaluated:Patient Re-evaluated prior to inductionOxygen Delivery Method: Circle System Utilized Preoxygenation: Pre-oxygenation with 100% oxygen Intubation Type: IV induction Ventilation: Mask ventilation without difficulty LMA: LMA inserted LMA Size: 4.0 Number of attempts: 1 Airway Equipment and Method: Bite block Placement Confirmation: positive ETCO2 Tube secured with: Tape Dental Injury: Teeth and Oropharynx as per pre-operative assessment

## 2015-05-31 ENCOUNTER — Encounter (HOSPITAL_BASED_OUTPATIENT_CLINIC_OR_DEPARTMENT_OTHER): Payer: Self-pay | Admitting: General Surgery

## 2015-06-14 ENCOUNTER — Telehealth: Payer: Self-pay | Admitting: *Deleted

## 2015-06-14 NOTE — Telephone Encounter (Signed)
Patient would like to cancel appointment with MD Magrinat. Patient is currently receiving radiation therapy in [facility]. POF sent to cancel appt.

## 2015-06-15 ENCOUNTER — Telehealth: Payer: Self-pay | Admitting: General Practice

## 2015-06-15 ENCOUNTER — Telehealth: Payer: Self-pay | Admitting: Oncology

## 2015-06-15 NOTE — Telephone Encounter (Signed)
Dr gm appointment cancelled per pof  anne

## 2015-06-20 ENCOUNTER — Ambulatory Visit: Payer: Medicare HMO | Admitting: Oncology

## 2015-06-27 ENCOUNTER — Other Ambulatory Visit: Payer: Self-pay | Admitting: Oncology

## 2015-06-27 ENCOUNTER — Telehealth: Payer: Self-pay | Admitting: *Deleted

## 2015-06-27 ENCOUNTER — Other Ambulatory Visit: Payer: Self-pay | Admitting: *Deleted

## 2015-06-27 NOTE — Telephone Encounter (Signed)
This RN called pt per received dictation from Robinson MD in [place] stating pt is transferring care to their faciltiy.  Noted pt called on 06/14/2015 to cancel appointment with Dr Jannifer Rodney on 06/20/2015.  Appointment was not rescheduled and documentation did not state reason for cancelling.  Per phone discussion- pt states she has also transferred her medical oncology care to Dr Collier Bullock at [facility].  This RN informed pt above would be documented.  No other questions per call.  Pt verbalized appreciation for follow up call regarding her care.  Dr Collier Bullock  [address] [place] [address]  Ph- [phone].

## 2015-06-29 ENCOUNTER — Ambulatory Visit: Payer: Medicare HMO

## 2015-06-29 ENCOUNTER — Ambulatory Visit: Payer: Medicare HMO | Admitting: Radiation Oncology
